# Patient Record
Sex: Female | Born: 1955 | Race: White | Hispanic: No | Marital: Married | Smoking: Former smoker
Health system: Southern US, Community
[De-identification: ages and names within clinical notes are randomized; demographics above are authoritative.]

## PROBLEM LIST (undated history)

## (undated) DIAGNOSIS — C50919 Malignant neoplasm of unspecified site of unspecified female breast: Principal | ICD-10-CM

## (undated) DIAGNOSIS — Q8789 Other specified congenital malformation syndromes, not elsewhere classified: Secondary | ICD-10-CM

## (undated) DIAGNOSIS — L813 Cafe au lait spots: Secondary | ICD-10-CM

## (undated) HISTORY — PX: KNEE ARTHROSCOPY WITH EXCISION BAKER'S CYST: SHX5646

## (undated) HISTORY — DX: Other specified congenital malformation syndromes, not elsewhere classified: Q87.89

## (undated) HISTORY — DX: Malignant neoplasm of unspecified site of unspecified female breast: C50.919

## (undated) HISTORY — DX: Other specified congenital malformation syndromes, not elsewhere classified: L81.3

## (undated) HISTORY — PX: CHOLECYSTECTOMY: SHX55

## (undated) HISTORY — PX: COLONOSCOPY: SHX174

---

## 1992-11-05 HISTORY — PX: TUBAL LIGATION: SHX77

## 1999-01-02 ENCOUNTER — Emergency Department (HOSPITAL_COMMUNITY): Admission: EM | Admit: 1999-01-02 | Discharge: 1999-01-02 | Payer: Self-pay | Admitting: Emergency Medicine

## 1999-10-18 ENCOUNTER — Other Ambulatory Visit: Admission: RE | Admit: 1999-10-18 | Discharge: 1999-10-18 | Payer: Self-pay | Admitting: Obstetrics and Gynecology

## 2000-11-11 ENCOUNTER — Other Ambulatory Visit: Admission: RE | Admit: 2000-11-11 | Discharge: 2000-11-11 | Payer: Self-pay | Admitting: Obstetrics and Gynecology

## 2002-02-18 ENCOUNTER — Other Ambulatory Visit: Admission: RE | Admit: 2002-02-18 | Discharge: 2002-02-18 | Payer: Self-pay | Admitting: Obstetrics and Gynecology

## 2002-07-29 ENCOUNTER — Encounter: Admission: RE | Admit: 2002-07-29 | Discharge: 2002-07-29 | Payer: Self-pay | Admitting: Obstetrics and Gynecology

## 2002-07-29 ENCOUNTER — Encounter: Payer: Self-pay | Admitting: Obstetrics and Gynecology

## 2002-08-11 ENCOUNTER — Encounter: Admission: RE | Admit: 2002-08-11 | Discharge: 2002-08-11 | Payer: Self-pay | Admitting: Obstetrics and Gynecology

## 2002-08-11 ENCOUNTER — Encounter: Payer: Self-pay | Admitting: Obstetrics and Gynecology

## 2002-08-20 ENCOUNTER — Ambulatory Visit (HOSPITAL_BASED_OUTPATIENT_CLINIC_OR_DEPARTMENT_OTHER): Admission: RE | Admit: 2002-08-20 | Discharge: 2002-08-20 | Payer: Self-pay | Admitting: General Surgery

## 2002-08-20 ENCOUNTER — Encounter (INDEPENDENT_AMBULATORY_CARE_PROVIDER_SITE_OTHER): Payer: Self-pay | Admitting: *Deleted

## 2003-04-05 ENCOUNTER — Encounter: Payer: Self-pay | Admitting: Obstetrics and Gynecology

## 2003-04-05 ENCOUNTER — Encounter: Admission: RE | Admit: 2003-04-05 | Discharge: 2003-04-05 | Payer: Self-pay | Admitting: Obstetrics and Gynecology

## 2004-03-27 ENCOUNTER — Other Ambulatory Visit: Admission: RE | Admit: 2004-03-27 | Discharge: 2004-03-27 | Payer: Self-pay | Admitting: Obstetrics and Gynecology

## 2004-05-11 ENCOUNTER — Encounter: Admission: RE | Admit: 2004-05-11 | Discharge: 2004-05-11 | Payer: Self-pay | Admitting: Obstetrics and Gynecology

## 2004-11-13 ENCOUNTER — Emergency Department (HOSPITAL_COMMUNITY): Admission: EM | Admit: 2004-11-13 | Discharge: 2004-11-13 | Payer: Self-pay | Admitting: Emergency Medicine

## 2005-02-07 ENCOUNTER — Ambulatory Visit: Payer: Self-pay | Admitting: Internal Medicine

## 2005-04-18 ENCOUNTER — Other Ambulatory Visit: Admission: RE | Admit: 2005-04-18 | Discharge: 2005-04-18 | Payer: Self-pay | Admitting: Obstetrics and Gynecology

## 2005-05-18 ENCOUNTER — Encounter: Admission: RE | Admit: 2005-05-18 | Discharge: 2005-05-18 | Payer: Self-pay | Admitting: Obstetrics and Gynecology

## 2006-04-25 ENCOUNTER — Other Ambulatory Visit: Admission: RE | Admit: 2006-04-25 | Discharge: 2006-04-25 | Payer: Self-pay | Admitting: Obstetrics and Gynecology

## 2006-05-20 ENCOUNTER — Encounter: Admission: RE | Admit: 2006-05-20 | Discharge: 2006-05-20 | Payer: Self-pay | Admitting: Obstetrics and Gynecology

## 2007-04-30 ENCOUNTER — Other Ambulatory Visit: Admission: RE | Admit: 2007-04-30 | Discharge: 2007-04-30 | Payer: Self-pay | Admitting: Obstetrics and Gynecology

## 2007-07-25 ENCOUNTER — Encounter: Admission: RE | Admit: 2007-07-25 | Discharge: 2007-07-25 | Payer: Self-pay | Admitting: Obstetrics and Gynecology

## 2007-11-06 DIAGNOSIS — C50919 Malignant neoplasm of unspecified site of unspecified female breast: Secondary | ICD-10-CM

## 2007-11-06 HISTORY — DX: Malignant neoplasm of unspecified site of unspecified female breast: C50.919

## 2007-12-15 ENCOUNTER — Ambulatory Visit: Payer: Self-pay | Admitting: Internal Medicine

## 2008-01-01 ENCOUNTER — Ambulatory Visit: Payer: Self-pay | Admitting: Internal Medicine

## 2008-01-01 ENCOUNTER — Encounter: Payer: Self-pay | Admitting: Internal Medicine

## 2008-05-03 ENCOUNTER — Other Ambulatory Visit: Admission: RE | Admit: 2008-05-03 | Discharge: 2008-05-03 | Payer: Self-pay | Admitting: Obstetrics and Gynecology

## 2008-05-12 ENCOUNTER — Encounter: Admission: RE | Admit: 2008-05-12 | Discharge: 2008-05-12 | Payer: Self-pay | Admitting: Obstetrics and Gynecology

## 2008-05-12 ENCOUNTER — Encounter (INDEPENDENT_AMBULATORY_CARE_PROVIDER_SITE_OTHER): Payer: Self-pay | Admitting: Diagnostic Radiology

## 2008-05-20 ENCOUNTER — Encounter: Admission: RE | Admit: 2008-05-20 | Discharge: 2008-05-20 | Payer: Self-pay | Admitting: Obstetrics and Gynecology

## 2008-06-05 HISTORY — PX: MASTECTOMY: SHX3

## 2008-06-07 ENCOUNTER — Encounter: Admission: RE | Admit: 2008-06-07 | Discharge: 2008-06-07 | Payer: Self-pay | Admitting: Surgery

## 2008-06-08 ENCOUNTER — Encounter (INDEPENDENT_AMBULATORY_CARE_PROVIDER_SITE_OTHER): Payer: Self-pay | Admitting: Surgery

## 2008-06-08 ENCOUNTER — Ambulatory Visit (HOSPITAL_BASED_OUTPATIENT_CLINIC_OR_DEPARTMENT_OTHER): Admission: RE | Admit: 2008-06-08 | Discharge: 2008-06-09 | Payer: Self-pay | Admitting: Surgery

## 2008-06-14 ENCOUNTER — Ambulatory Visit: Payer: Self-pay | Admitting: Oncology

## 2008-06-22 ENCOUNTER — Ambulatory Visit: Payer: Self-pay | Admitting: Hematology & Oncology

## 2008-07-17 ENCOUNTER — Emergency Department (HOSPITAL_BASED_OUTPATIENT_CLINIC_OR_DEPARTMENT_OTHER): Admission: EM | Admit: 2008-07-17 | Discharge: 2008-07-18 | Payer: Self-pay | Admitting: Emergency Medicine

## 2008-08-02 LAB — COMPREHENSIVE METABOLIC PANEL
ALT: 14 U/L (ref 0–35)
AST: 11 U/L (ref 0–37)
Albumin: 4.5 g/dL (ref 3.5–5.2)
Alkaline Phosphatase: 70 U/L (ref 39–117)
Glucose, Bld: 117 mg/dL — ABNORMAL HIGH (ref 70–99)
Potassium: 4.2 mEq/L (ref 3.5–5.3)
Sodium: 137 mEq/L (ref 135–145)
Total Protein: 7.4 g/dL (ref 6.0–8.3)

## 2008-08-02 LAB — CBC WITH DIFFERENTIAL (CANCER CENTER ONLY)
BASO%: 0.7 % (ref 0.0–2.0)
HCT: 39.7 % (ref 34.8–46.6)
LYMPH%: 6.3 % — ABNORMAL LOW (ref 14.0–48.0)
MCH: 29 pg (ref 26.0–34.0)
MCV: 84 fL (ref 81–101)
MONO%: 6 % (ref 0.0–13.0)
NEUT%: 86 % — ABNORMAL HIGH (ref 39.6–80.0)
Platelets: 412 10*3/uL — ABNORMAL HIGH (ref 145–400)
RDW: 13.1 % (ref 10.5–14.6)

## 2008-08-20 ENCOUNTER — Ambulatory Visit: Payer: Self-pay | Admitting: Hematology & Oncology

## 2008-08-23 LAB — COMPREHENSIVE METABOLIC PANEL
Albumin: 4.5 g/dL (ref 3.5–5.2)
CO2: 21 mEq/L (ref 19–32)
Calcium: 10.2 mg/dL (ref 8.4–10.5)
Glucose, Bld: 106 mg/dL — ABNORMAL HIGH (ref 70–99)
Sodium: 137 mEq/L (ref 135–145)
Total Bilirubin: 0.7 mg/dL (ref 0.3–1.2)
Total Protein: 7.1 g/dL (ref 6.0–8.3)

## 2008-08-23 LAB — CBC WITH DIFFERENTIAL (CANCER CENTER ONLY)
BASO#: 0.1 10*3/uL (ref 0.0–0.2)
Eosinophils Absolute: 0.2 10*3/uL (ref 0.0–0.5)
HCT: 35.8 % (ref 34.8–46.6)
HGB: 12.3 g/dL (ref 11.6–15.9)
LYMPH%: 4.5 % — ABNORMAL LOW (ref 14.0–48.0)
MCH: 29.2 pg (ref 26.0–34.0)
MCV: 85 fL (ref 81–101)
MONO#: 1.2 10*3/uL — ABNORMAL HIGH (ref 0.1–0.9)
MONO%: 6.4 % (ref 0.0–13.0)
NEUT%: 87.4 % — ABNORMAL HIGH (ref 39.6–80.0)
RBC: 4.21 10*6/uL (ref 3.70–5.32)

## 2008-09-13 LAB — CBC WITH DIFFERENTIAL (CANCER CENTER ONLY)
BASO%: 0.7 % (ref 0.0–2.0)
EOS%: 1.4 % (ref 0.0–7.0)
LYMPH%: 4.3 % — ABNORMAL LOW (ref 14.0–48.0)
MCH: 29.9 pg (ref 26.0–34.0)
MCHC: 34.2 g/dL (ref 32.0–36.0)
MCV: 87 fL (ref 81–101)
MONO%: 3.7 % (ref 0.0–13.0)
Platelets: 287 10*3/uL (ref 145–400)
RDW: 13.6 % (ref 10.5–14.6)

## 2008-10-04 LAB — CBC WITH DIFFERENTIAL (CANCER CENTER ONLY)
BASO#: 0.1 10*3/uL (ref 0.0–0.2)
Eosinophils Absolute: 0.2 10*3/uL (ref 0.0–0.5)
HGB: 12.4 g/dL (ref 11.6–15.9)
LYMPH#: 1 10*3/uL (ref 0.9–3.3)
MCH: 29.5 pg (ref 26.0–34.0)
MONO#: 1 10*3/uL — ABNORMAL HIGH (ref 0.1–0.9)
MONO%: 5.4 % (ref 0.0–13.0)
NEUT#: 16.2 10*3/uL — ABNORMAL HIGH (ref 1.5–6.5)
RBC: 4.2 10*6/uL (ref 3.70–5.32)
WBC: 18.6 10*3/uL — ABNORMAL HIGH (ref 3.9–10.0)

## 2008-10-04 LAB — COMPREHENSIVE METABOLIC PANEL
ALT: 14 U/L (ref 0–35)
Alkaline Phosphatase: 88 U/L (ref 39–117)
Glucose, Bld: 123 mg/dL — ABNORMAL HIGH (ref 70–99)
Sodium: 139 mEq/L (ref 135–145)
Total Bilirubin: 0.5 mg/dL (ref 0.3–1.2)
Total Protein: 7 g/dL (ref 6.0–8.3)

## 2008-10-28 ENCOUNTER — Ambulatory Visit: Payer: Self-pay | Admitting: Hematology & Oncology

## 2008-11-01 LAB — CBC WITH DIFFERENTIAL (CANCER CENTER ONLY)
BASO#: 0.1 10*3/uL (ref 0.0–0.2)
Eosinophils Absolute: 0.1 10*3/uL (ref 0.0–0.5)
HGB: 13.8 g/dL (ref 11.6–15.9)
LYMPH%: 7.6 % — ABNORMAL LOW (ref 14.0–48.0)
MCH: 29.1 pg (ref 26.0–34.0)
MCHC: 33.9 g/dL (ref 32.0–36.0)
MCV: 86 fL (ref 81–101)
MONO%: 7.2 % (ref 0.0–13.0)
RBC: 4.74 10*6/uL (ref 3.70–5.32)

## 2008-11-01 LAB — COMPREHENSIVE METABOLIC PANEL
Alkaline Phosphatase: 73 U/L (ref 39–117)
Creatinine, Ser: 0.59 mg/dL (ref 0.40–1.20)
Glucose, Bld: 100 mg/dL — ABNORMAL HIGH (ref 70–99)
Sodium: 139 mEq/L (ref 135–145)
Total Bilirubin: 0.8 mg/dL (ref 0.3–1.2)
Total Protein: 7.1 g/dL (ref 6.0–8.3)

## 2008-11-29 LAB — CBC WITH DIFFERENTIAL (CANCER CENTER ONLY)
BASO#: 0 10*3/uL (ref 0.0–0.2)
Eosinophils Absolute: 0.3 10*3/uL (ref 0.0–0.5)
HCT: 35.8 % (ref 34.8–46.6)
HGB: 12.4 g/dL (ref 11.6–15.9)
LYMPH#: 1.1 10*3/uL (ref 0.9–3.3)
MCH: 28.8 pg (ref 26.0–34.0)
MONO%: 6.8 % (ref 0.0–13.0)
NEUT#: 3.6 10*3/uL (ref 1.5–6.5)
NEUT%: 67.2 % (ref 39.6–80.0)
RBC: 4.29 10*6/uL (ref 3.70–5.32)

## 2008-11-29 LAB — COMPREHENSIVE METABOLIC PANEL
CO2: 25 mEq/L (ref 19–32)
Calcium: 9.2 mg/dL (ref 8.4–10.5)
Chloride: 104 mEq/L (ref 96–112)
Creatinine, Ser: 0.63 mg/dL (ref 0.40–1.20)
Glucose, Bld: 97 mg/dL (ref 70–99)
Total Bilirubin: 0.8 mg/dL (ref 0.3–1.2)
Total Protein: 6.2 g/dL (ref 6.0–8.3)

## 2008-11-29 LAB — FOLLICLE STIMULATING HORMONE: FSH: 91 m[IU]/mL

## 2008-11-29 LAB — LUTEINIZING HORMONE: LH: 53.7 m[IU]/mL

## 2008-12-06 HISTORY — PX: BREAST RECONSTRUCTION: SHX9

## 2008-12-08 LAB — ESTRADIOL, ULTRA SENS

## 2008-12-31 ENCOUNTER — Ambulatory Visit: Payer: Self-pay | Admitting: Hematology & Oncology

## 2009-01-03 LAB — COMPREHENSIVE METABOLIC PANEL
ALT: 10 U/L (ref 0–35)
Albumin: 4.1 g/dL (ref 3.5–5.2)
CO2: 30 mEq/L (ref 19–32)
Glucose, Bld: 86 mg/dL (ref 70–99)
Potassium: 4.3 mEq/L (ref 3.5–5.3)
Sodium: 140 mEq/L (ref 135–145)
Total Protein: 6.4 g/dL (ref 6.0–8.3)

## 2009-01-03 LAB — CBC WITH DIFFERENTIAL (CANCER CENTER ONLY)
BASO#: 0 10*3/uL (ref 0.0–0.2)
EOS%: 4.3 % (ref 0.0–7.0)
Eosinophils Absolute: 0.3 10*3/uL (ref 0.0–0.5)
HCT: 39.1 % (ref 34.8–46.6)
HGB: 12.8 g/dL (ref 11.6–15.9)
LYMPH#: 1 10*3/uL (ref 0.9–3.3)
MCHC: 32.7 g/dL (ref 32.0–36.0)
MONO#: 0.4 10*3/uL (ref 0.1–0.9)
NEUT#: 5.6 10*3/uL (ref 1.5–6.5)
NEUT%: 75.9 % (ref 39.6–80.0)
RBC: 4.65 10*6/uL (ref 3.70–5.32)
WBC: 7.4 10*3/uL (ref 3.9–10.0)

## 2009-03-11 ENCOUNTER — Ambulatory Visit: Payer: Self-pay | Admitting: Hematology & Oncology

## 2009-03-14 LAB — CBC WITH DIFFERENTIAL (CANCER CENTER ONLY)
BASO%: 0.4 % (ref 0.0–2.0)
EOS%: 4.1 % (ref 0.0–7.0)
LYMPH#: 1.1 10*3/uL (ref 0.9–3.3)
MCHC: 33.8 g/dL (ref 32.0–36.0)
MONO#: 0.3 10*3/uL (ref 0.1–0.9)
NEUT#: 2.4 10*3/uL (ref 1.5–6.5)
Platelets: 249 10*3/uL (ref 145–400)
RDW: 13.2 % (ref 10.5–14.6)
WBC: 4 10*3/uL (ref 3.9–10.0)

## 2009-03-14 LAB — COMPREHENSIVE METABOLIC PANEL
BUN: 11 mg/dL (ref 6–23)
CO2: 28 mEq/L (ref 19–32)
Calcium: 9.2 mg/dL (ref 8.4–10.5)
Creatinine, Ser: 0.7 mg/dL (ref 0.40–1.20)
Glucose, Bld: 77 mg/dL (ref 70–99)
Total Bilirubin: 0.8 mg/dL (ref 0.3–1.2)

## 2009-04-16 IMAGING — CR DG CHEST 2V
2 series · 2 of 2 positions shown · non-contrast
Comparison: None available

CLINICAL DATA: Breast carcinoma

CHEST - 2 VIEW

[w chest pa]
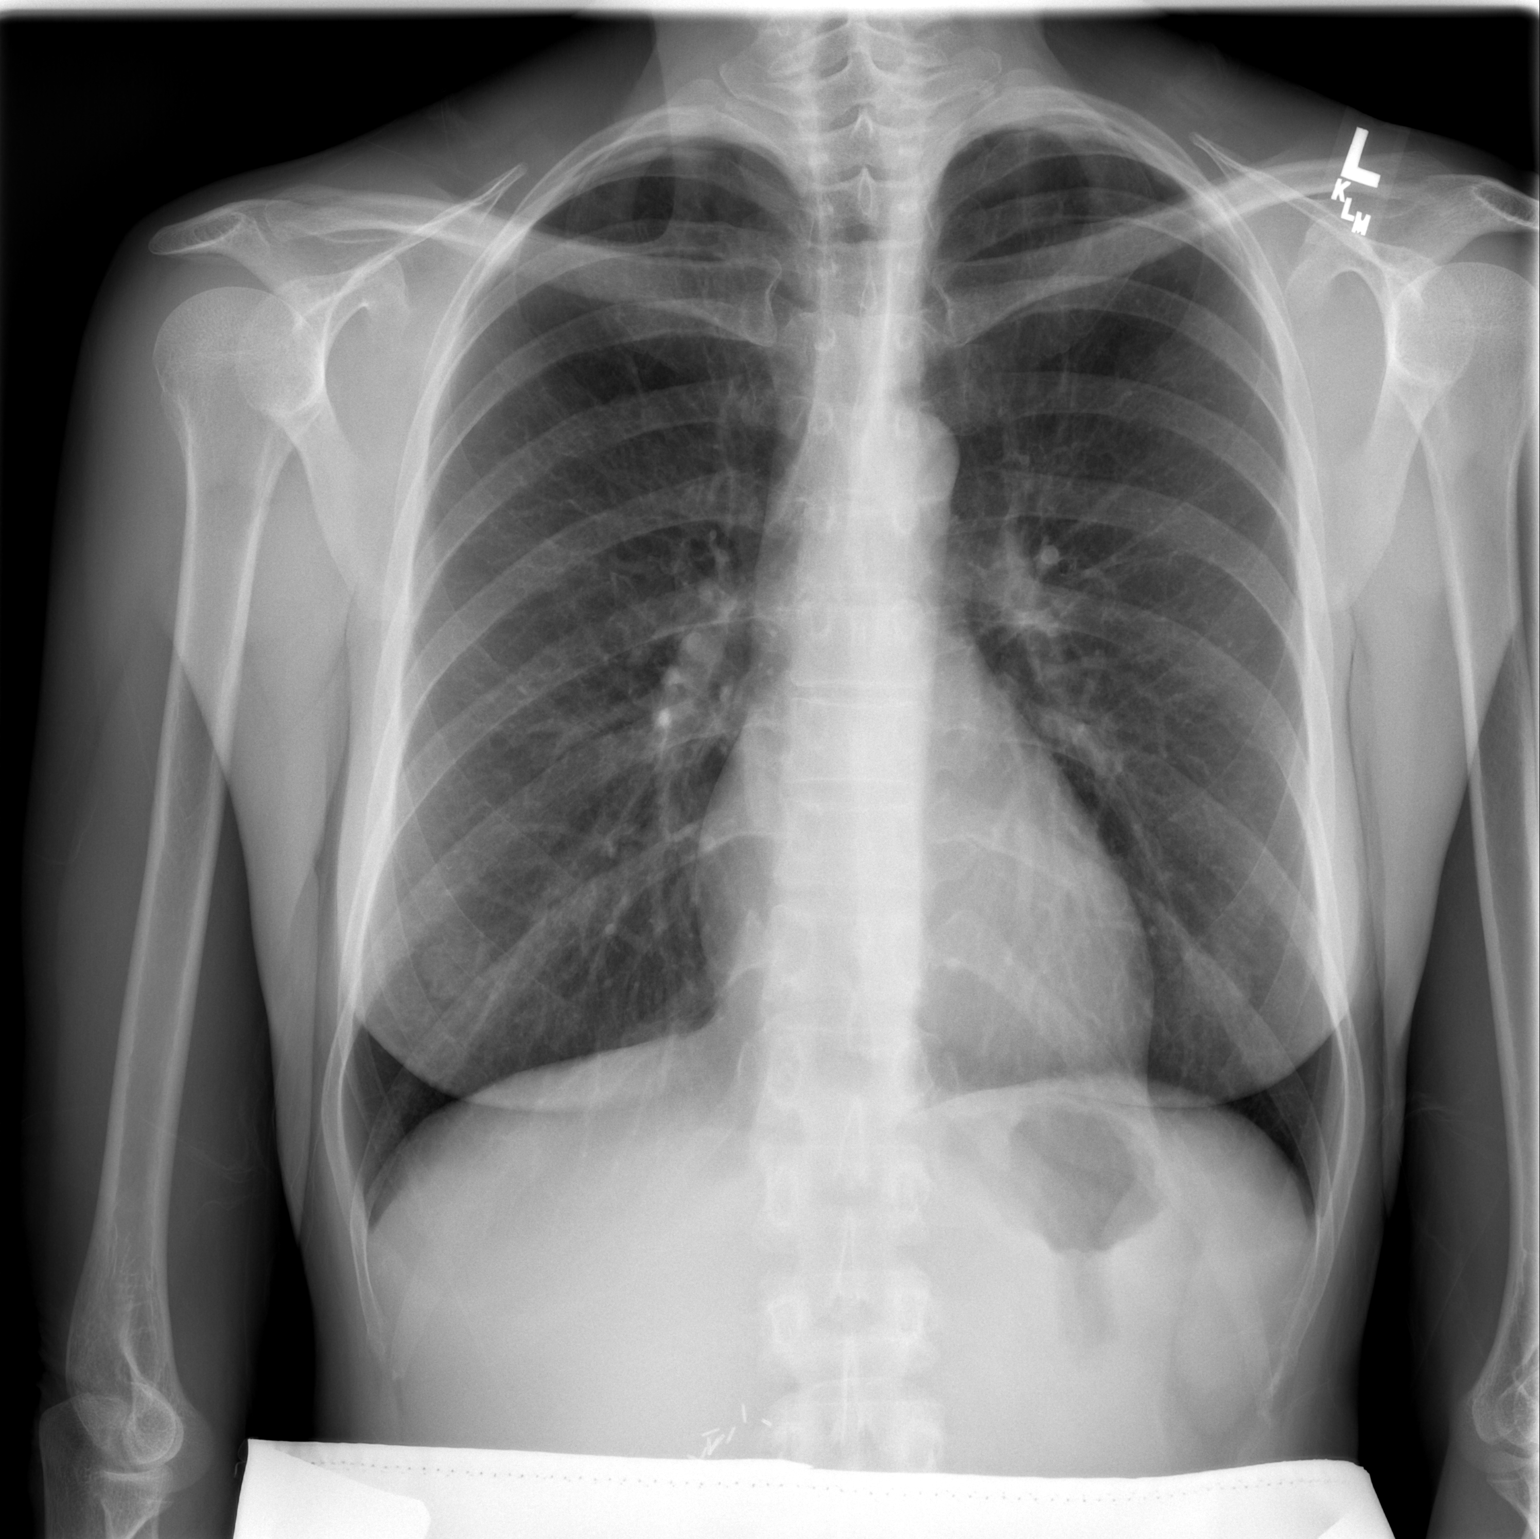

[w chest lat]
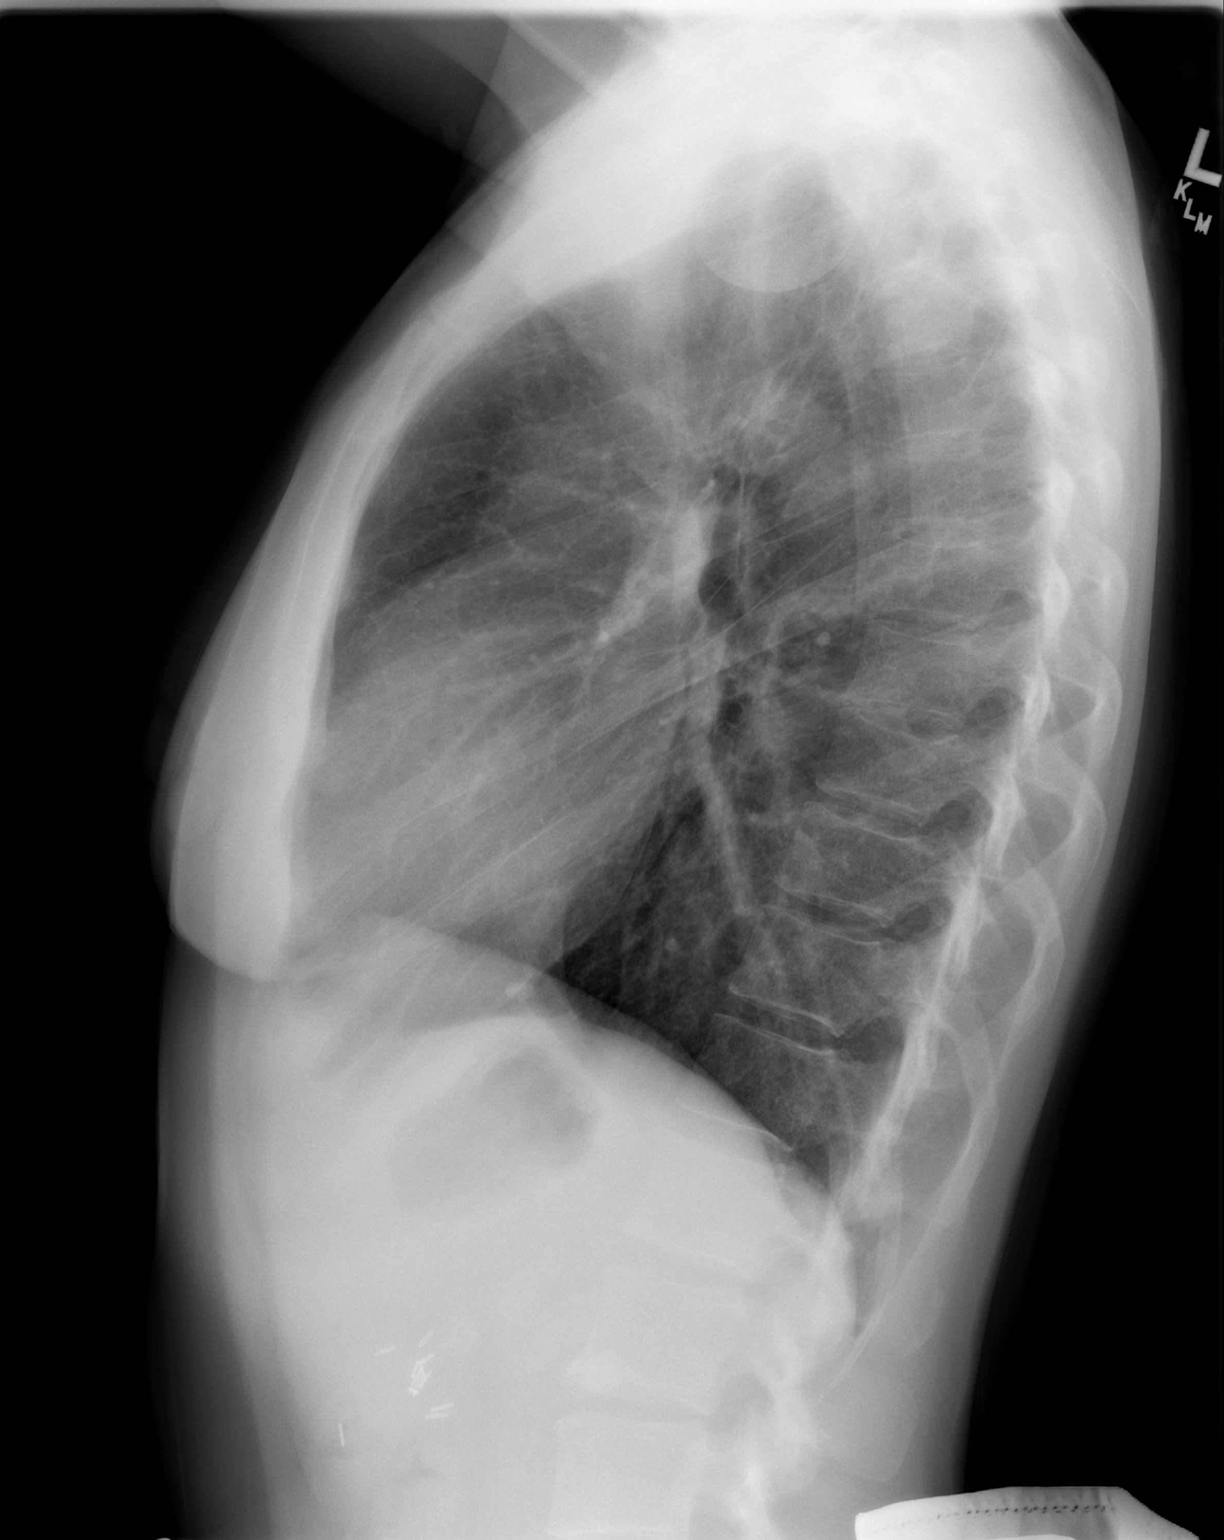

[2 of 2 positions shown; findings below may reference images not displayed]

FINDINGS: Vascular clips in the right upper abdomen.  The lower
abdomen was shielded. Lungs clear.  Heart size and pulmonary
vascularity normal.  No effusion.  Visualized bones unremarkable.]
IMPRESSION: [1.  No acute disease

## 2009-05-10 ENCOUNTER — Emergency Department (HOSPITAL_BASED_OUTPATIENT_CLINIC_OR_DEPARTMENT_OTHER): Admission: EM | Admit: 2009-05-10 | Discharge: 2009-05-10 | Payer: Self-pay | Admitting: Emergency Medicine

## 2009-06-01 ENCOUNTER — Other Ambulatory Visit: Admission: RE | Admit: 2009-06-01 | Discharge: 2009-06-01 | Payer: Self-pay | Admitting: Obstetrics and Gynecology

## 2009-06-14 ENCOUNTER — Ambulatory Visit: Payer: Self-pay | Admitting: Hematology & Oncology

## 2009-06-15 LAB — CBC WITH DIFFERENTIAL (CANCER CENTER ONLY)
BASO%: 1.2 % (ref 0.0–2.0)
Eosinophils Absolute: 0.3 10*3/uL (ref 0.0–0.5)
MONO#: 0.4 10*3/uL (ref 0.1–0.9)
MONO%: 7.3 % (ref 0.0–13.0)
NEUT#: 3.6 10*3/uL (ref 1.5–6.5)
Platelets: 254 10*3/uL (ref 145–400)
RBC: 4.83 10*6/uL (ref 3.70–5.32)
RDW: 12.4 % (ref 10.5–14.6)
WBC: 5.9 10*3/uL (ref 3.9–10.0)

## 2009-06-16 LAB — COMPREHENSIVE METABOLIC PANEL
ALT: 10 U/L (ref 0–35)
AST: 11 U/L (ref 0–37)
Alkaline Phosphatase: 70 U/L (ref 39–117)
CO2: 27 mEq/L (ref 19–32)
Sodium: 137 mEq/L (ref 135–145)
Total Bilirubin: 0.8 mg/dL (ref 0.3–1.2)
Total Protein: 6.6 g/dL (ref 6.0–8.3)

## 2009-06-17 ENCOUNTER — Encounter: Admission: RE | Admit: 2009-06-17 | Discharge: 2009-06-17 | Payer: Self-pay | Admitting: Hematology & Oncology

## 2009-09-13 ENCOUNTER — Ambulatory Visit: Payer: Self-pay | Admitting: Hematology & Oncology

## 2009-09-14 LAB — CBC WITH DIFFERENTIAL (CANCER CENTER ONLY)
EOS%: 4.3 % (ref 0.0–7.0)
MCH: 28.8 pg (ref 26.0–34.0)
MCHC: 35 g/dL (ref 32.0–36.0)
MONO%: 6.5 % (ref 0.0–13.0)
NEUT#: 3.8 10*3/uL (ref 1.5–6.5)
Platelets: 242 10*3/uL (ref 145–400)
RDW: 12 % (ref 10.5–14.6)

## 2009-09-15 LAB — COMPREHENSIVE METABOLIC PANEL
Albumin: 4.4 g/dL (ref 3.5–5.2)
CO2: 30 mEq/L (ref 19–32)
Calcium: 9.9 mg/dL (ref 8.4–10.5)
Glucose, Bld: 89 mg/dL (ref 70–99)
Sodium: 141 mEq/L (ref 135–145)
Total Bilirubin: 0.6 mg/dL (ref 0.3–1.2)
Total Protein: 6.9 g/dL (ref 6.0–8.3)

## 2009-09-15 LAB — VITAMIN D 25 HYDROXY (VIT D DEFICIENCY, FRACTURES): Vit D, 25-Hydroxy: 33 ng/mL (ref 30–89)

## 2009-12-14 ENCOUNTER — Ambulatory Visit: Payer: Self-pay | Admitting: Hematology & Oncology

## 2009-12-15 LAB — CBC WITH DIFFERENTIAL (CANCER CENTER ONLY)
Eosinophils Absolute: 0.2 10*3/uL (ref 0.0–0.5)
HCT: 42.5 % (ref 34.8–46.6)
LYMPH%: 22.9 % (ref 14.0–48.0)
MCV: 86 fL (ref 81–101)
MONO#: 0.4 10*3/uL (ref 0.1–0.9)
NEUT%: 67.4 % (ref 39.6–80.0)
RBC: 4.97 10*6/uL (ref 3.70–5.32)
WBC: 5.9 10*3/uL (ref 3.9–10.0)

## 2009-12-15 LAB — COMPREHENSIVE METABOLIC PANEL
BUN: 13 mg/dL (ref 6–23)
CO2: 26 mEq/L (ref 19–32)
Creatinine, Ser: 0.73 mg/dL (ref 0.40–1.20)
Glucose, Bld: 89 mg/dL (ref 70–99)
Total Bilirubin: 1.1 mg/dL (ref 0.3–1.2)

## 2010-04-06 ENCOUNTER — Ambulatory Visit: Payer: Self-pay | Admitting: Hematology & Oncology

## 2010-04-06 LAB — CBC WITH DIFFERENTIAL (CANCER CENTER ONLY)
EOS%: 3.4 % (ref 0.0–7.0)
Eosinophils Absolute: 0.2 10*3/uL (ref 0.0–0.5)
LYMPH%: 25.5 % (ref 14.0–48.0)
MCH: 28.8 pg (ref 26.0–34.0)
MCHC: 33.8 g/dL (ref 32.0–36.0)
MCV: 85 fL (ref 81–101)
MONO%: 5.9 % (ref 0.0–13.0)
Platelets: 244 10*3/uL (ref 145–400)
RBC: 4.88 10*6/uL (ref 3.70–5.32)

## 2010-04-06 LAB — COMPREHENSIVE METABOLIC PANEL
AST: 10 U/L (ref 0–37)
Alkaline Phosphatase: 82 U/L (ref 39–117)
BUN: 10 mg/dL (ref 6–23)
Glucose, Bld: 107 mg/dL — ABNORMAL HIGH (ref 70–99)
Sodium: 139 mEq/L (ref 135–145)
Total Bilirubin: 1.2 mg/dL (ref 0.3–1.2)
Total Protein: 6.6 g/dL (ref 6.0–8.3)

## 2010-08-08 ENCOUNTER — Ambulatory Visit (HOSPITAL_BASED_OUTPATIENT_CLINIC_OR_DEPARTMENT_OTHER): Payer: BC Managed Care – PPO | Admitting: Hematology & Oncology

## 2010-08-10 LAB — CBC WITH DIFFERENTIAL (CANCER CENTER ONLY)
BASO#: 0 10*3/uL (ref 0.0–0.2)
BASO%: 0.5 % (ref 0.0–2.0)
EOS%: 3.8 % (ref 0.0–7.0)
HCT: 42.8 % (ref 34.8–46.6)
HGB: 14.2 g/dL (ref 11.6–15.9)
LYMPH#: 1.5 10*3/uL (ref 0.9–3.3)
MCHC: 33.2 g/dL (ref 32.0–36.0)
MONO#: 0.5 10*3/uL (ref 0.1–0.9)
NEUT#: 3.6 10*3/uL (ref 1.5–6.5)
NEUT%: 61.1 % (ref 39.6–80.0)
WBC: 5.8 10*3/uL (ref 3.9–10.0)

## 2010-08-11 LAB — COMPREHENSIVE METABOLIC PANEL
ALT: 14 U/L (ref 0–35)
AST: 15 U/L (ref 0–37)
Albumin: 4.8 g/dL (ref 3.5–5.2)
BUN: 12 mg/dL (ref 6–23)
CO2: 26 mEq/L (ref 19–32)
Calcium: 9.8 mg/dL (ref 8.4–10.5)
Chloride: 101 mEq/L (ref 96–112)
Creatinine, Ser: 0.73 mg/dL (ref 0.40–1.20)
Potassium: 4.2 mEq/L (ref 3.5–5.3)

## 2010-08-11 LAB — VITAMIN D 25 HYDROXY (VIT D DEFICIENCY, FRACTURES): Vit D, 25-Hydroxy: 60 ng/mL (ref 30–89)

## 2010-11-27 ENCOUNTER — Encounter: Payer: Self-pay | Admitting: Obstetrics and Gynecology

## 2010-12-14 ENCOUNTER — Encounter (HOSPITAL_BASED_OUTPATIENT_CLINIC_OR_DEPARTMENT_OTHER): Payer: BC Managed Care – PPO | Admitting: Hematology & Oncology

## 2010-12-14 DIAGNOSIS — C50419 Malignant neoplasm of upper-outer quadrant of unspecified female breast: Secondary | ICD-10-CM

## 2010-12-14 LAB — CBC WITH DIFFERENTIAL (CANCER CENTER ONLY)
BASO%: 0.6 % (ref 0.0–2.0)
EOS%: 3.5 % (ref 0.0–7.0)
HGB: 14.4 g/dL (ref 11.6–15.9)
LYMPH#: 1.4 10*3/uL (ref 0.9–3.3)
MCH: 28.7 pg (ref 26.0–34.0)
MCHC: 33.7 g/dL (ref 32.0–36.0)
MONO%: 6.4 % (ref 0.0–13.0)
NEUT#: 3.6 10*3/uL (ref 1.5–6.5)
Platelets: 244 10*3/uL (ref 145–400)

## 2010-12-15 LAB — COMPREHENSIVE METABOLIC PANEL
ALT: 12 U/L (ref 0–35)
AST: 11 U/L (ref 0–37)
Albumin: 4.3 g/dL (ref 3.5–5.2)
Alkaline Phosphatase: 89 U/L (ref 39–117)
BUN: 13 mg/dL (ref 6–23)
Calcium: 9.7 mg/dL (ref 8.4–10.5)
Chloride: 102 mEq/L (ref 96–112)
Potassium: 4.1 mEq/L (ref 3.5–5.3)
Sodium: 138 mEq/L (ref 135–145)
Total Protein: 6.8 g/dL (ref 6.0–8.3)

## 2011-03-20 NOTE — Op Note (Signed)
Anna Barton, Anna Barton               ACCOUNT NO.:  [id number]   MEDICAL RECORD NO.:  [id number]          PATIENT TYPE:  AMB   LOCATION:  DSC                          FACILITY:  [facility]   PHYSICIAN:  Thomas A. Cornett, M.D.DATE OF BIRTH:  [birth year]   DATE OF PROCEDURE:  06/08/2008  DATE OF DISCHARGE:                               OPERATIVE REPORT   PREOPERATIVE DIAGNOSIS:  Right breast cancer.   POSTOPERATIVE DIAGNOSIS:  Right breast cancer.   PROCEDURES:  1. Bilateral simple mastectomies.  2. Right axillary sentinel lymph node mapping with injection of      methylene blue dye.   SURGEON:  Maisie Fus A. Cornett, MD   ANESTHESIA:  LMA.   ESTIMATED BLOOD LOSS:  30 mL.   SPECIMEN:  1. Both breasts to Pathology.  2. Three right axillary sentinel lymph nodes negative by touch prep.   DRAINS:  Two 19 Blake drains in the right side and one 19 Blake drain in  the left side.   INDICATIONS FOR PROCEDURE:  The patient is a 55 year old female with a 3-  cm right breast cancer.  She had talked about options initially and  wanted to undergo a simple mastectomy since she had no interest in  preoperative chemotherapy.  She had no interest in breast conserving  surgery.  She then decided she wished to have both breasts removed at  the same time given her young age and concern for developing a second  breast cancer on her left side.  She was counseled and agreed to proceed  after thinking about this and expressing that was her desire.   DESCRIPTION OF PROCEDURE:  The patient was brought to the operating  room.  She was then placed supine and LMA anesthesia was initiated.  She  was injected in the right breast by the radiology technician with  technetium sulfur colloid.  I then injected 4 mL of methylene blue dye  in a subareolar position on the right breast.  We then prepped and  draped in a sterile fashion both breasts.  The left breast was addressed  first.  A fishmouth incision above and  below the nipple-areolar complex  was created.  Superior and inferior skin flaps were created using the  cautery up towards the clavicle and down to the inferior mammary fold.  The breast was then dissected off the chest wall in a medial to lateral  fashion and amputated prior to entering the axilla on the left.  Hemostasis was achieved with cautery.  Through a separate stab incision,  a 19 Blake drain was placed.  This was secured to the skin with 2-0  nylon.  I then closed the wound after irrigating the wound and assuring  hemostasis with 3-0 Vicryl pop-offs and 4-0 Monocryl in a subcuticular  stitch.   We then switched to the right side.  The left side was covered.  In a  similar fashion, a fishmouth incision was used.  The superior and  inferior skin flaps were then created up to the clavicle and down to the  inferior mammary fold.  Once we got  up in the left axillary region, we  used the NeoProbe and identified 3 sentinel lymph nodes, 2 of these were  blue and hot, and the third was just hot.  All 3 sentinel nodes were  negative by touch prep.  We irrigated out the axilla; when we were done,  this was dry.  I then completed the superior flap.  The inferior flap  was then completed as well at this point in time down to the  inframammary fold.  The breast was then dissected away in a medial to  lateral fashion to include the tail of Spence as well, but leaving the  axillary contents behind.  Of note, the tail of Mliss Sax was also removed  on the left side.  After removal, we inspected for hemostasis and  irrigated and found to be excellent.  We then closed the wound in layers  with a 3-0 Vicryl deep layer and a subsequent 4-0 Monocryl layer.  Of  note, 2 drains were used in this side and these were placed through  separate stab incisions and secured to the skin with 2-0 nylon.  Once  the incisions were closed, Dermabond was applied as a dressing.  Drain  sponges were placed around the  drains.  All final counts of sponge,  needle, and instruments were found to be correct at this portion of the  case.  The drains were placed to suction with good seal.  She was then  awoke and taken to recovery in satisfactory condition.      Thomas A. Cornett, M.D.  Electronically Signed     TAC/MEDQ  D:  06/08/2008  T:  06/09/2008  Job:  [id number]   cc:   Holley Bouche, M.D.

## 2011-03-30 ENCOUNTER — Encounter (INDEPENDENT_AMBULATORY_CARE_PROVIDER_SITE_OTHER): Payer: Self-pay | Admitting: Surgery

## 2011-06-14 ENCOUNTER — Other Ambulatory Visit: Payer: Self-pay | Admitting: Hematology & Oncology

## 2011-06-14 ENCOUNTER — Encounter (HOSPITAL_BASED_OUTPATIENT_CLINIC_OR_DEPARTMENT_OTHER): Payer: BC Managed Care – PPO | Admitting: Hematology & Oncology

## 2011-06-14 DIAGNOSIS — C50419 Malignant neoplasm of upper-outer quadrant of unspecified female breast: Secondary | ICD-10-CM

## 2011-06-14 LAB — CBC WITH DIFFERENTIAL (CANCER CENTER ONLY)
BASO#: 0.1 10e3/uL (ref 0.0–0.2)
BASO%: 0.9 % (ref 0.0–2.0)
EOS%: 5.7 % (ref 0.0–7.0)
Eosinophils Absolute: 0.4 10e3/uL (ref 0.0–0.5)
HCT: 39.5 % (ref 34.8–46.6)
HGB: 13.8 g/dL (ref 11.6–15.9)
LYMPH#: 1.4 10e3/uL (ref 0.9–3.3)
LYMPH%: 22.3 % (ref 14.0–48.0)
MCH: 29.1 pg (ref 26.0–34.0)
MCHC: 34.9 g/dL (ref 32.0–36.0)
MCV: 83 fL (ref 81–101)
MONO#: 0.6 10e3/uL (ref 0.1–0.9)
MONO%: 8.8 % (ref 0.0–13.0)
NEUT#: 4 10e3/uL (ref 1.5–6.5)
NEUT%: 62.3 % (ref 39.6–80.0)
Platelets: 244 10e3/uL (ref 145–400)
RBC: 4.75 10e6/uL (ref 3.70–5.32)
RDW: 13 % (ref 11.1–15.7)
WBC: 6.5 10e3/uL (ref 3.9–10.0)

## 2011-06-26 ENCOUNTER — Encounter (INDEPENDENT_AMBULATORY_CARE_PROVIDER_SITE_OTHER): Payer: Self-pay | Admitting: Surgery

## 2011-07-27 ENCOUNTER — Ambulatory Visit (INDEPENDENT_AMBULATORY_CARE_PROVIDER_SITE_OTHER): Payer: BC Managed Care – PPO | Admitting: Surgery

## 2011-07-27 ENCOUNTER — Encounter (INDEPENDENT_AMBULATORY_CARE_PROVIDER_SITE_OTHER): Payer: Self-pay | Admitting: Surgery

## 2011-07-27 VITALS — BP 112/72 | HR 72 | Temp 98.4°F | Resp 12 | Ht 67.0 in | Wt 147.0 lb

## 2011-07-27 DIAGNOSIS — Z853 Personal history of malignant neoplasm of breast: Secondary | ICD-10-CM

## 2011-07-27 NOTE — Progress Notes (Signed)
Patient returns for one year followup. She is 3 years out from bilateral mastectomy and reconstruction for stage II right breast cancer. She continues on femara. She has no complaints  Review of systems: Negative for chest wall mass, pain.  Exam: Both breasts surgically absent. Reconstructions intact. No masses. No evidence of axillary adenopathy bilaterally  Impression: Stage II right breast cancer ER positive PR positive status post bilateral mastectomy with reconstruction without signs of recurrence  Plan: Return to clinic one year. If her exam is stable she will be released from a followup.

## 2011-07-27 NOTE — Patient Instructions (Signed)
Follow up 1 year 

## 2011-08-03 LAB — DIFFERENTIAL
Basophils Absolute: 0.1
Eosinophils Absolute: 0.2
Eosinophils Relative: 3
Lymphs Abs: 1.1
Neutrophils Relative %: 71

## 2011-08-03 LAB — COMPREHENSIVE METABOLIC PANEL
ALT: 12
AST: 12
Albumin: 3.6
CO2: 31
Calcium: 9.1
Creatinine, Ser: 0.61
GFR calc Af Amer: >60
Sodium: 140
Total Protein: 6.3

## 2011-08-03 LAB — CBC
HCT: 42.2
MCV: 88.5
Platelets: 208
RDW: 12.8
WBC: 6.1

## 2011-08-08 LAB — DIFFERENTIAL
Basophils Absolute: 0
Basophils Relative: 1
Eosinophils Absolute: 0.1
Lymphocytes Relative: 12
Lymphs Abs: 0.5 — ABNORMAL LOW
Monocytes Relative: 7
Neutro Abs: 3.3

## 2011-08-08 LAB — URINALYSIS, ROUTINE W REFLEX MICROSCOPIC
Glucose, UA: NEGATIVE
Hgb urine dipstick: NEGATIVE
Ketones, ur: NEGATIVE
Protein, ur: NEGATIVE
Urobilinogen, UA: 0.2

## 2011-08-08 LAB — URINE CULTURE

## 2011-08-08 LAB — CBC
HCT: 43.7
Hemoglobin: 15.1 — ABNORMAL HIGH
MCV: 84.6
Platelets: 149 — ABNORMAL LOW
RDW: 12.3

## 2011-08-08 LAB — CULTURE, BLOOD (ROUTINE X 2)
Culture: NO GROWTH
Culture: NO GROWTH

## 2011-08-08 LAB — BASIC METABOLIC PANEL
BUN: 8
Chloride: 97
Glucose, Bld: 127 — ABNORMAL HIGH
Potassium: 3.9
Sodium: 136

## 2011-12-13 ENCOUNTER — Other Ambulatory Visit (HOSPITAL_BASED_OUTPATIENT_CLINIC_OR_DEPARTMENT_OTHER): Payer: BC Managed Care – PPO | Admitting: Lab

## 2011-12-13 ENCOUNTER — Encounter: Payer: Self-pay | Admitting: Hematology & Oncology

## 2011-12-13 ENCOUNTER — Ambulatory Visit (HOSPITAL_BASED_OUTPATIENT_CLINIC_OR_DEPARTMENT_OTHER): Payer: BC Managed Care – PPO | Admitting: Hematology & Oncology

## 2011-12-13 VITALS — BP 124/77 | HR 75 | Temp 98.2°F | Ht 67.0 in | Wt 152.0 lb

## 2011-12-13 DIAGNOSIS — Z170 Estrogen receptor positive status [ER+]: Secondary | ICD-10-CM

## 2011-12-13 DIAGNOSIS — C50919 Malignant neoplasm of unspecified site of unspecified female breast: Secondary | ICD-10-CM | POA: Insufficient documentation

## 2011-12-13 DIAGNOSIS — E559 Vitamin D deficiency, unspecified: Secondary | ICD-10-CM

## 2011-12-13 DIAGNOSIS — C50419 Malignant neoplasm of upper-outer quadrant of unspecified female breast: Secondary | ICD-10-CM

## 2011-12-13 LAB — CBC WITH DIFFERENTIAL (CANCER CENTER ONLY)
BASO#: 0.1 10*3/uL (ref 0.0–0.2)
EOS%: 6.2 % (ref 0.0–7.0)
Eosinophils Absolute: 0.5 10*3/uL (ref 0.0–0.5)
HCT: 42.1 % (ref 34.8–46.6)
HGB: 14.2 g/dL (ref 11.6–15.9)
LYMPH#: 1.5 10*3/uL (ref 0.9–3.3)
MONO#: 0.8 10*3/uL (ref 0.1–0.9)
NEUT#: 4.5 10*3/uL (ref 1.5–6.5)
NEUT%: 62.3 % (ref 39.6–80.0)
RBC: 4.94 10*6/uL (ref 3.70–5.32)
WBC: 7.2 10*3/uL (ref 3.9–10.0)

## 2011-12-13 MED ORDER — LETROZOLE 2.5 MG PO TABS: 2.5000 mg | ORAL_TABLET | Freq: Every day | ORAL | Status: DC

## 2011-12-13 NOTE — Progress Notes (Signed)
This office note has been dictated.

## 2011-12-14 LAB — COMPREHENSIVE METABOLIC PANEL
AST: 15 U/L (ref 0–37)
Albumin: 4.3 g/dL (ref 3.5–5.2)
BUN: 13 mg/dL (ref 6–23)
CO2: 27 mEq/L (ref 19–32)
Calcium: 9.6 mg/dL (ref 8.4–10.5)
Chloride: 103 mEq/L (ref 96–112)
Glucose, Bld: 74 mg/dL (ref 70–99)
Potassium: 3.9 mEq/L (ref 3.5–5.3)

## 2011-12-14 LAB — VITAMIN D 25 HYDROXY (VIT D DEFICIENCY, FRACTURES): Vit D, 25-Hydroxy: 70 ng/mL (ref 30–89)

## 2011-12-14 NOTE — Progress Notes (Signed)
CC:   Artist Pais, M.D. Holley Bouche, M.D. Thomas A. Cornett, M.D. 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  CURRENT THERAPY: 1. Femara 2.5 mg p.o. daily. 2. Zometa 4 mg IV each year.  INTERIM HISTORY:  Ms. Viscomi comes in for her follow-up.  She is doing great.  We last saw her back in August.  She has had no problems since then.  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  Again, this has not been an issue for her.  She has had no problems with fevers, sweats or chills.  She is doing well with the Femara.  She has not had any arthralgias.  She is staying very active.  There has been no change in bowel or bladder habits.  She has had no bleeding or bruising.  There has been no fever, sweats or chills.  PHYSICAL EXAMINATION:  General Appearance:  This is a well-developed, well-nourished white female in no obvious distress.  Vital Signs:  98.2, pulse 75, respiratory rate 20, blood pressure 124/77.  Weight is 152. Head and Neck Exam:  Shows a normocephalic, atraumatic skull.  There are no ocular or oral lesions.  There are no palpable cervical or supraclavicular lymph nodes.  Lungs:  Clear bilaterally.  Cardiac Exam: Regular rate and rhythm with a normal S1 and S2.  There are no murmurs, rubs or bruits.  Abdominal Exam:  Soft with good bowel sounds.  There is no palpable abdominal mass.  There is no palpable hepatosplenomegaly. Breast Exam: Shows bilateral breast reconstruction.  The reconstruction sites are intact.  There is no nodularity about the chest wall.  There is no bilateral axillary adenopathy.  Extremities:  Show no clubbing, cyanosis or edema.  Skin Exam:  Shows scattered neurofibromas.  LABORATORY STUDIES:  White cell count is 7.2, hemoglobin 14.2, hematocrit 42.1, platelet count 279.  IMPRESSION:  Ms. Kinker is a 56 year old white female with stage IIA infiltrating ductal carcinoma of the  right breast.  She is on Femara. She completed the chemotherapy back in December 2009.  She had 6 cycles of Taxotere/Cytoxan.  We will continue on the Femara.  We are checking vitamin D levels on her.  She is taking calcium and vitamin D.  We will go ahead and plan to see her back in 6 months.  At that point in time, we will go ahead and do Zometa.    ______________________________ Josph Macho, M.D. PRE/MEDQ  D:  12/13/2011  T:  12/14/2011  Job:  [id number]

## 2011-12-17 ENCOUNTER — Telehealth: Payer: Self-pay | Admitting: *Deleted

## 2011-12-17 NOTE — Telephone Encounter (Signed)
Message copied by Anselm Jungling on Mon Dec 17, 2011 12:00 PM ------      Message from: Arlan Organ R      Created: Thu Dec 13, 2011  6:12 PM       Call and tell her labs are ok.  pete

## 2011-12-17 NOTE — Telephone Encounter (Signed)
Called patient to let her know that her labwork was all good per dr. ennever 

## 2012-01-03 ENCOUNTER — Other Ambulatory Visit: Payer: Self-pay | Admitting: Hematology & Oncology

## 2012-01-03 ENCOUNTER — Other Ambulatory Visit: Payer: Self-pay | Admitting: *Deleted

## 2012-01-04 ENCOUNTER — Other Ambulatory Visit: Payer: Self-pay | Admitting: *Deleted

## 2012-01-04 DIAGNOSIS — C50919 Malignant neoplasm of unspecified site of unspecified female breast: Secondary | ICD-10-CM

## 2012-01-04 MED ORDER — LETROZOLE 2.5 MG PO TABS: 2.5000 mg | ORAL_TABLET | Freq: Every day | ORAL | Status: DC

## 2012-01-04 NOTE — Telephone Encounter (Signed)
Pt called stating she only received a month supply of generic letrozole when it was last filled and normally gets a years worth. Explained to her that it was most likely a mistake. Reviewed chart. This is a chronic med for the pt. New rx for 11 refill sent via eprescribe to [facility]. Pt aware.

## 2012-02-07 ENCOUNTER — Other Ambulatory Visit: Payer: Self-pay | Admitting: *Deleted

## 2012-02-07 NOTE — Telephone Encounter (Signed)
error 

## 2012-05-05 NOTE — Telephone Encounter (Signed)
Error

## 2012-06-10 ENCOUNTER — Other Ambulatory Visit: Payer: Self-pay | Admitting: Medical

## 2012-06-10 ENCOUNTER — Ambulatory Visit (HOSPITAL_BASED_OUTPATIENT_CLINIC_OR_DEPARTMENT_OTHER): Payer: BC Managed Care – PPO | Admitting: Medical

## 2012-06-10 ENCOUNTER — Other Ambulatory Visit (HOSPITAL_BASED_OUTPATIENT_CLINIC_OR_DEPARTMENT_OTHER): Payer: BC Managed Care – PPO | Admitting: Lab

## 2012-06-10 ENCOUNTER — Ambulatory Visit (HOSPITAL_BASED_OUTPATIENT_CLINIC_OR_DEPARTMENT_OTHER): Payer: BC Managed Care – PPO

## 2012-06-10 VITALS — BP 166/79 | HR 82 | Temp 97.2°F | Resp 20 | Ht 67.0 in | Wt 146.0 lb

## 2012-06-10 DIAGNOSIS — C50919 Malignant neoplasm of unspecified site of unspecified female breast: Secondary | ICD-10-CM

## 2012-06-10 DIAGNOSIS — Z170 Estrogen receptor positive status [ER+]: Secondary | ICD-10-CM

## 2012-06-10 DIAGNOSIS — M810 Age-related osteoporosis without current pathological fracture: Secondary | ICD-10-CM

## 2012-06-10 DIAGNOSIS — C50419 Malignant neoplasm of upper-outer quadrant of unspecified female breast: Secondary | ICD-10-CM

## 2012-06-10 LAB — CBC WITH DIFFERENTIAL (CANCER CENTER ONLY)
EOS%: 3.6 % (ref 0.0–7.0)
Eosinophils Absolute: 0.3 10*3/uL (ref 0.0–0.5)
LYMPH#: 1.7 10*3/uL (ref 0.9–3.3)
MCH: 28.8 pg (ref 26.0–34.0)
MCHC: 34 g/dL (ref 32.0–36.0)
MONO%: 8.1 % (ref 0.0–13.0)
NEUT#: 5.4 10*3/uL (ref 1.5–6.5)
Platelets: 266 10*3/uL (ref 145–400)
RBC: 5.03 10*6/uL (ref 3.70–5.32)

## 2012-06-10 MED ORDER — ZOLEDRONIC ACID 4 MG/100ML IV SOLN
4.0000 mg | Freq: Once | INTRAVENOUS | Status: AC
  Administered 2012-06-10: 4 mg via INTRAVENOUS
  Filled 2012-06-10: qty 100

## 2012-06-10 NOTE — Progress Notes (Signed)
Patient Name : Anna Barton, Anna Barton. MR #[id number] DOB: 01-Jan-1956 Encounter Date: 06/10/2012 Dictated by Eunice Blase, PA-C  Diagnosis: #1 stage II a (T2, N0, M0) ductal carcinoma of the right breast, ER positive/HER-2 negative. (S/P 6 cycles of Taxotere/Cytoxan back in December 2009).  Current therapy: #1 Femara 2.5 mg by mouth daily. #2 Zometa 4 mg IV once a year.  Interim history: Anna Barton comes in today for an office followup visit.  Her husband accompanies her.  Overall, Anna Barton reports, that she's been doing relatively well.  She reports, that she thinks another neurofibroma has appeared in the center of her breast, but wants it checked out to make sure.  She does see a dermatologist once a year.  She also continues to followup with her surgeon and plastic surgeon and gynecologist on a yearly basis as well.  She remains on the Femara without any issues.  She denies any headaches, any visual changes, any, bone or joint pain.  She denies any type of obvious, bleeding.  She denies any cough, chest pain, shortness of breath, any fevers, chills, or night sweats.  She continues to stay very active.  She has a good appetite.  She denies any nausea, vomiting, diarrhea, or constipation.  She denies any upper or lower extremity swelling.  She continues to receive Zometa once a year.  She denies any problems with any teeth or gum issues.  Review of Systems: Pt. Denies any changes in their vision, hearing, adenopathy, fevers, chills, nausea, vomiting, diarrhea, constipation, chest pain, shortness of breath, passing blood, passing out, blacking out,  any changes in skin, joints, neurologic or psychiatric except as noted.  Physical Exam: This is a pleasant, 56 year old, well-developed, well-nourished, white female, in no obvious distress Vitals: Temperature 97.8 degrees, pulse 82, respirations 20, blood pressure 166/79, weight 146 pounds HEENT reveals a normocephalic, atraumatic skull, no scleral  icterus, no oral lesions  Neck is supple without any cervical or supraclavicular adenopathy.  Lungs are clear to auscultation bilaterally. There are no wheezes, rales or rhonci Cardiac is regular rate and rhythm with a normal S1 and S2. There are no murmurs, rubs, or bruits.  Abdomen is soft with good bowel sounds there's a palpable mass. There is no palpable hepatosplenomegaly. There is no palpable fluid wave.  Musculoskeletal no tenderness of the spine, ribs, or hips.  Extremities there are no clubbing, cyanosis, or edema.  Skin no petechia, purpura or ecchymosis-+ small neurofibromas on her extremities/stomach and center of chest  Neurologic is nonfocal. Breast exam: Shows bilateral breast reconstruction.  No nodularity or b/l axillary adenopathy.   Laboratory Data: White count 8.2, hemoglobin 14.5, hematocrit 42.7, platelets 266,000 Last vitamin D level was within normal limits at 70 mg/ML  Current Outpatient Prescriptions on File Prior to Visit  Medication Sig Dispense Refill  . cholecalciferol (VITAMIN D) 1000 UNITS tablet Take 6,000 Units by mouth daily.       Marland Kitchen letrozole (FEMARA) 2.5 MG tablet Take 1 tablet (2.5 mg total) by mouth daily.  30 tablet  11  . Multiple Vitamins-Minerals (MULTIVITAMIN WITH MINERALS) tablet Take 1 tablet by mouth daily.        Assessment/Plan: This is a pleasant, 56 year old, female, with the following issues. #1 stage IIA infiltrating ductal carcinoma of the right breast.  She completed 6 cycles of Taxotere/Cytoxan.  Back in December of 2009  She is currently on the Femara 2.5 mg by mouth daily.  She is doing very, well.  We'll continue with  the Femara.  #2.  Supportive therapy-she will continue on Zometa 4 mg IV once a year.  #3.  She will continue on vitamin D and calcium.  #4.  Followup-Anna Barton will follow back up with [place] in 6 months, but before then should there be questions or concerns.

## 2012-06-10 NOTE — Patient Instructions (Signed)
Zoledronic Acid injection (Hypercalcemia, Oncology) What is this medicine? ZOLEDRONIC ACID (ZOE le dron ik AS id) lowers the amount of calcium loss from bone. It is used to treat too much calcium in your blood from cancer. It is also used to prevent complications of cancer that has spread to the bone. This medicine may be used for other purposes; ask your health care provider or pharmacist if you have questions. What should I tell my health care provider before I take this medicine? They need to know if you have any of these conditions: -aspirin-sensitive asthma -dental disease -kidney disease -an unusual or allergic reaction to zoledronic acid, other medicines, foods, dyes, or preservatives -pregnant or trying to get pregnant -breast-feeding How should I use this medicine? This medicine is for infusion into a vein. It is given by a health care professional in a hospital or clinic setting. Talk to your pediatrician regarding the use of this medicine in children. Special care may be needed. Overdosage: If you think you have taken too much of this medicine contact a poison control center or emergency room at once. NOTE: This medicine is only for you. Do not share this medicine with others. What if I miss a dose? It is important not to miss your dose. Call your doctor or health care professional if you are unable to keep an appointment. What may interact with this medicine? -certain antibiotics given by injection -NSAIDs, medicines for pain and inflammation, like ibuprofen or naproxen -some diuretics like bumetanide, furosemide -teriparatide -thalidomide This list may not describe all possible interactions. Give your health care provider a list of all the medicines, herbs, non-prescription drugs, or dietary supplements you use. Also tell them if you smoke, drink alcohol, or use illegal drugs. Some items may interact with your medicine. What should I watch for while using this medicine? Visit  your doctor or health care professional for regular checkups. It may be some time before you see the benefit from this medicine. Do not stop taking your medicine unless your doctor tells you to. Your doctor may order blood tests or other tests to see how you are doing. Women should inform their doctor if they wish to become pregnant or think they might be pregnant. There is a potential for serious side effects to an unborn child. Talk to your health care professional or pharmacist for more information. You should make sure that you get enough calcium and vitamin D while you are taking this medicine. Discuss the foods you eat and the vitamins you take with your health care professional. Some people who take this medicine have severe bone, joint, and/or muscle pain. This medicine may also increase your risk for a broken thigh bone. Tell your doctor right away if you have pain in your upper leg or groin. Tell your doctor if you have any pain that does not go away or that gets worse. What side effects may I notice from receiving this medicine? Side effects that you should report to your doctor or health care professional as soon as possible: -allergic reactions like skin rash, itching or hives, swelling of the face, lips, or tongue -anxiety, confusion, or depression -breathing problems -changes in vision -feeling faint or lightheaded, falls -jaw burning, cramping, pain -muscle cramps, stiffness, or weakness -trouble passing urine or change in the amount of urine Side effects that usually do not require medical attention (report to your doctor or health care professional if they continue or are bothersome): -bone, joint, or muscle pain -  fever -hair loss -irritation at site where injected -loss of appetite -nausea, vomiting -stomach upset -tired This list may not describe all possible side effects. Call your doctor for medical advice about side effects. You may report side effects to FDA at  1-800-FDA-1088. Where should I keep my medicine? This drug is given in a hospital or clinic and will not be stored at home. NOTE: This sheet is a summary. It may not cover all possible information. If you have questions about this medicine, talk to your doctor, pharmacist, or health care provider.  2012, [employer] Standard. (04/20/2011 9:06:58 AM) 

## 2012-06-11 LAB — COMPREHENSIVE METABOLIC PANEL
AST: 12 U/L (ref 0–37)
Albumin: 4.3 g/dL (ref 3.5–5.2)
Alkaline Phosphatase: 88 U/L (ref 39–117)
Potassium: 3.6 mEq/L (ref 3.5–5.3)
Sodium: 140 mEq/L (ref 135–145)
Total Protein: 7.2 g/dL (ref 6.0–8.3)

## 2012-06-12 ENCOUNTER — Other Ambulatory Visit: Payer: BC Managed Care – PPO | Admitting: Lab

## 2012-06-12 ENCOUNTER — Telehealth: Payer: Self-pay | Admitting: Hematology & Oncology

## 2012-06-12 ENCOUNTER — Ambulatory Visit: Payer: BC Managed Care – PPO

## 2012-06-12 ENCOUNTER — Ambulatory Visit: Payer: BC Managed Care – PPO | Admitting: Hematology & Oncology

## 2012-06-12 NOTE — Telephone Encounter (Addendum)
Message copied by Cathi Roan on Thu Jun 12, 2012 11:08 AM ------      Message from: Josph Macho      Created: Tue Jun 10, 2012  5:39 PM       Please call and let her know thatPete her lab work looks good.Anna Barton  06-12-12 11:10am  Called and spoke to patient regarding above MD note, S.[facility] LPN

## 2012-07-15 ENCOUNTER — Ambulatory Visit (INDEPENDENT_AMBULATORY_CARE_PROVIDER_SITE_OTHER): Payer: BC Managed Care – PPO | Admitting: Surgery

## 2012-08-11 ENCOUNTER — Ambulatory Visit (INDEPENDENT_AMBULATORY_CARE_PROVIDER_SITE_OTHER): Payer: BC Managed Care – PPO | Admitting: Surgery

## 2012-08-11 ENCOUNTER — Encounter (INDEPENDENT_AMBULATORY_CARE_PROVIDER_SITE_OTHER): Payer: Self-pay | Admitting: Surgery

## 2012-08-11 VITALS — BP 140/82 | HR 96 | Temp 98.4°F | Resp 16 | Ht 67.0 in | Wt 131.6 lb

## 2012-08-11 DIAGNOSIS — Z853 Personal history of malignant neoplasm of breast: Secondary | ICD-10-CM | POA: Insufficient documentation

## 2012-08-11 NOTE — Progress Notes (Signed)
Patient returns for one year followup. She is 3 years out from bilateral mastectomy and reconstruction for stage II right breast cancer. She continues on femara. She has no complaints  Review of systems: Negative for chest wall mass, pain.  Exam: Both breasts surgically absent. Reconstructions intact. No masses. No evidence of axillary adenopathy bilaterally  Impression: Stage II right breast cancer ER positive PR positive status post bilateral mastectomy with reconstruction without signs of recurrence  Plan: Return as needed.  Pt followed every 6 months by Dr Twanna Hy.

## 2012-08-11 NOTE — Patient Instructions (Signed)
Return as needed

## 2012-12-11 ENCOUNTER — Ambulatory Visit: Payer: BC Managed Care – PPO

## 2012-12-11 ENCOUNTER — Other Ambulatory Visit (HOSPITAL_BASED_OUTPATIENT_CLINIC_OR_DEPARTMENT_OTHER): Payer: BC Managed Care – PPO | Admitting: Lab

## 2012-12-11 ENCOUNTER — Ambulatory Visit (HOSPITAL_BASED_OUTPATIENT_CLINIC_OR_DEPARTMENT_OTHER): Payer: BC Managed Care – PPO | Admitting: Hematology & Oncology

## 2012-12-11 ENCOUNTER — Other Ambulatory Visit: Payer: BC Managed Care – PPO | Admitting: Lab

## 2012-12-11 ENCOUNTER — Ambulatory Visit: Payer: BC Managed Care – PPO | Admitting: Hematology & Oncology

## 2012-12-11 VITALS — BP 135/71 | HR 86 | Temp 98.3°F | Resp 16 | Ht 67.0 in | Wt 140.0 lb

## 2012-12-11 DIAGNOSIS — C50419 Malignant neoplasm of upper-outer quadrant of unspecified female breast: Secondary | ICD-10-CM

## 2012-12-11 DIAGNOSIS — Z170 Estrogen receptor positive status [ER+]: Secondary | ICD-10-CM

## 2012-12-11 DIAGNOSIS — C50919 Malignant neoplasm of unspecified site of unspecified female breast: Secondary | ICD-10-CM

## 2012-12-11 DIAGNOSIS — E559 Vitamin D deficiency, unspecified: Secondary | ICD-10-CM

## 2012-12-11 DIAGNOSIS — M810 Age-related osteoporosis without current pathological fracture: Secondary | ICD-10-CM

## 2012-12-11 DIAGNOSIS — F411 Generalized anxiety disorder: Secondary | ICD-10-CM

## 2012-12-11 LAB — CBC WITH DIFFERENTIAL (CANCER CENTER ONLY)
BASO%: 0.8 % (ref 0.0–2.0)
EOS%: 2.5 % (ref 0.0–7.0)
LYMPH%: 20.4 % (ref 14.0–48.0)
MCH: 28.7 pg (ref 26.0–34.0)
MCHC: 33.5 g/dL (ref 32.0–36.0)
MCV: 86 fL (ref 81–101)
MONO%: 6.9 % (ref 0.0–13.0)
Platelets: 266 10*3/uL (ref 145–400)
RDW: 13.3 % (ref 11.1–15.7)

## 2012-12-11 NOTE — Progress Notes (Signed)
No IV Zometa today per dr. Myna Hidalgo.  Patient will be due in august,.

## 2012-12-11 NOTE — Progress Notes (Signed)
This office note has been dictated.

## 2012-12-12 LAB — COMPREHENSIVE METABOLIC PANEL
ALT: 18 U/L (ref 0–35)
Alkaline Phosphatase: 73 U/L (ref 39–117)
Glucose, Bld: 119 mg/dL — ABNORMAL HIGH (ref 70–99)
Sodium: 137 mEq/L (ref 135–145)
Total Bilirubin: 0.9 mg/dL (ref 0.3–1.2)
Total Protein: 6.9 g/dL (ref 6.0–8.3)

## 2012-12-12 NOTE — Progress Notes (Signed)
CC:   Anna Barton, M.D.  DIAGNOSIS:  Stage IIA (T2 N0 M0) ductal carcinoma of the right breast.  CURRENT THERAPY: 1. Femara 2.5 mg p.o. daily. 2. Zometa 4 mg IV once a year.  INTERIM HISTORY:  Anna Barton comes in for her followup.  Unfortunately, she broke her right foot.  She did this falling off the bathroom bathtub ledge.  This was about 7 weeks ago.  She is in a walking boot.  This is getting better.  Otherwise, she is doing well.  She was having some issues with anxiety. She saw an endocrine medicine specialist.  She now is on a protocol which she says is making her feel better.  She also now is on Celexa. This also has made her feel better. She lost quite a bit weight but is gaining weight back.  Her thyroid from what she says has not been a problem.  She has had no issues with the neurofibromatosis.  She is taking her vitamin D and calcium.  When we last saw her, her vitamin D was 80.  Her last mammogram was a year ago.  PHYSICAL EXAMINATION:  General:  This is a well-developed, well- nourished white female in no obvious distress.  Vital signs:  Show temperature of 98.3, pulse 86, respiratory rate 16, blood pressure 135/71.  Weight is 140.  Head and neck:  Shows a normocephalic, atraumatic skull.  There are no ocular or oral lesions.  There are no palpable cervical or supraclavicular lymph nodes.  Lungs:  Clear bilaterally.  Cardiac:  Regular rate and rhythm with a normal S1, S2. There are no murmurs, rubs or bruits.  Breasts:  Shows left breast with no masses, edema or erythema.  There is no axillary adenopathy.  Right breast has an implant.  This is well seated.  There is no right axillary adenopathy.  Abdomen:  Soft with good bowel sounds.  There is no palpable abdominal mass.  There is no palpable hepatosplenomegaly. Extremities:  Shows the walking boot on the right foot.  Skin:  Shows the neurofibromas.  LABORATORY STUDIES:  White cell count is 7.1,  hemoglobin 14.2, hematocrit 42.4, platelet count 266.  IMPRESSION:  Anna Barton is a very nice 57 year old white female who looks a whole lot younger.  She has stage IIA ductal carcinoma of the right breast.  She is node negative.  She is ER positive and HER2 negative.  She completed chemotherapy with Cytoxan/Taxotere.  She completed this back in December of 2009.  She is on Femara.  She is doing well on Femara.  She will complete 5 years of Femara I think at the end of this year.  She gets the Zometa once a year.  She will have this done in August.  We will go ahead and plan to get her back to see [place] in another 6 months.    ______________________________ Josph Macho, M.D. PRE/MEDQ  D:  12/11/2012  T:  12/12/2012  Job:  [id number]

## 2012-12-31 ENCOUNTER — Encounter: Payer: Self-pay | Admitting: *Deleted

## 2013-01-07 ENCOUNTER — Encounter: Payer: Self-pay | Admitting: Internal Medicine

## 2013-01-14 ENCOUNTER — Other Ambulatory Visit: Payer: Self-pay | Admitting: *Deleted

## 2013-01-14 DIAGNOSIS — C50919 Malignant neoplasm of unspecified site of unspecified female breast: Secondary | ICD-10-CM

## 2013-01-14 MED ORDER — LETROZOLE 2.5 MG PO TABS: 2.5000 mg | ORAL_TABLET | Freq: Every day | ORAL | Status: DC

## 2013-01-14 NOTE — Telephone Encounter (Signed)
Received refill request from [facility] for Letrozole. This is a chronic med for the pt. Refilled via e-rx.

## 2013-01-22 ENCOUNTER — Encounter: Payer: Self-pay | Admitting: Internal Medicine

## 2013-02-18 ENCOUNTER — Ambulatory Visit (AMBULATORY_SURGERY_CENTER): Payer: BC Managed Care – PPO | Admitting: *Deleted

## 2013-02-18 ENCOUNTER — Encounter: Payer: Self-pay | Admitting: Internal Medicine

## 2013-02-18 VITALS — Ht 67.0 in | Wt 140.0 lb

## 2013-02-18 DIAGNOSIS — Z86010 Personal history of colonic polyps: Secondary | ICD-10-CM

## 2013-02-18 DIAGNOSIS — Z1211 Encounter for screening for malignant neoplasm of colon: Secondary | ICD-10-CM

## 2013-02-18 MED ORDER — MOVIPREP 100 G PO SOLR: 1.0000 | Freq: Once | ORAL | Status: DC

## 2013-02-18 NOTE — Progress Notes (Signed)
Patient denies allergy to eggs or soy products. Denies any complications with sedation or anesthesia.

## 2013-03-04 ENCOUNTER — Encounter: Payer: Self-pay | Admitting: Internal Medicine

## 2013-03-04 ENCOUNTER — Ambulatory Visit (AMBULATORY_SURGERY_CENTER): Payer: BC Managed Care – PPO | Admitting: Internal Medicine

## 2013-03-04 VITALS — BP 150/79 | HR 58 | Temp 97.6°F | Resp 22 | Ht 67.0 in | Wt 140.0 lb

## 2013-03-04 DIAGNOSIS — Z1211 Encounter for screening for malignant neoplasm of colon: Secondary | ICD-10-CM

## 2013-03-04 DIAGNOSIS — Z860100 Personal history of colon polyps, unspecified: Secondary | ICD-10-CM

## 2013-03-04 DIAGNOSIS — D126 Benign neoplasm of colon, unspecified: Secondary | ICD-10-CM

## 2013-03-04 MED ORDER — SODIUM CHLORIDE 0.9 % IV SOLN: 500.0000 mL | INTRAVENOUS | Status: DC

## 2013-03-04 NOTE — Patient Instructions (Addendum)
A handouts was given to your care partner on polyps.  You may resume your current medications today.  Please call if any questions or concerns.    YOU HAD AN ENDOSCOPIC PROCEDURE TODAY AT THE [facility]: Refer to the procedure report that was given to you for any specific questions about what was found during the examination.  If the procedure report does not answer your questions, please call your gastroenterologist to clarify.  If you requested that your care partner not be given the details of your procedure findings, then the procedure report has been included in a sealed envelope for you to review at your convenience later.  YOU SHOULD EXPECT: Some feelings of bloating in the abdomen. Passage of more gas than usual.  Walking can help get rid of the air that was put into your GI tract during the procedure and reduce the bloating. If you had a lower endoscopy (such as a colonoscopy or flexible sigmoidoscopy) you may notice spotting of blood in your stool or on the toilet paper. If you underwent a bowel prep for your procedure, then you may not have a normal bowel movement for a few days.  DIET: Your first meal following the procedure should be a light meal and then it is ok to progress to your normal diet.  A half-sandwich or bowl of soup is an example of a good first meal.  Heavy or fried foods are harder to digest and may make you feel nauseous or bloated.  Likewise meals heavy in dairy and vegetables can cause extra gas to form and this can also increase the bloating.  Drink plenty of fluids but you should avoid alcoholic beverages for 24 hours.  ACTIVITY: Your care partner should take you home directly after the procedure.  You should plan to take it easy, moving slowly for the rest of the day.  You can resume normal activity the day after the procedure however you should NOT DRIVE or use heavy machinery for 24 hours (because of the sedation medicines used during the test).     SYMPTOMS TO REPORT IMMEDIATELY: A gastroenterologist can be reached at any hour.  During normal business hours, 8:30 AM to 5:00 PM Monday through Friday, call [phone].  After hours and on weekends, please call the GI answering service at [phone] who will take a message and have the physician on call contact you.   Following lower endoscopy (colonoscopy or flexible sigmoidoscopy):  Excessive amounts of blood in the stool  Significant tenderness or worsening of abdominal pains  Swelling of the abdomen that is new, acute  Fever of 100F or higher    FOLLOW UP: If any biopsies were taken you will be contacted by phone or by letter within the next 1-3 weeks.  Call your gastroenterologist if you have not heard about the biopsies in 3 weeks.  Our staff will call the home number listed on your records the next business day following your procedure to check on you and address any questions or concerns that you may have at that time regarding the information given to you following your procedure. This is a courtesy call and so if there is no answer at the home number and we have not heard from you through the emergency physician on call, we will assume that you have returned to your regular daily activities without incident.  SIGNATURES/CONFIDENTIALITY: You and/or your care partner have signed paperwork which will be entered into your electronic medical record.  These signatures attest to the fact that that the information above on your After Visit Summary has been reviewed and is understood.  Full responsibility of the confidentiality of this discharge information lies with you and/or your care-partner.

## 2013-03-04 NOTE — Progress Notes (Signed)
Patient did not experience any of the following events: a burn prior to discharge; a fall within the facility; wrong site/side/patient/procedure/implant event; or a hospital transfer or hospital admission upon discharge from the facility. (G8907) Patient did not have preoperative order for IV antibiotic SSI prophylaxis. (G8918)  

## 2013-03-04 NOTE — Progress Notes (Signed)
No complaints noted in the recovery room. Maw   

## 2013-03-04 NOTE — Op Note (Signed)
[facility] [address] [place], [address]   COLONOSCOPY PROCEDURE REPORT  PATIENT: Anna Barton, Anna Barton  MR#: [id number] BIRTHDATE: 14-Aug-1956 , 56  yrs. old GENDER: Female ENDOSCOPIST: Hart Carwin, MD REFERRED BY:  Johny Blamer, M.D. , Dr Demetrius Charity.Ennever PROCEDURE DATE:  03/04/2013 PROCEDURE:   Colonoscopy with snare polypectomy ASA CLASS:   Class II INDICATIONS:Patient's family history of colon polyps, Patient's personal history of adenomatous colon polyps, and colon 2003, 2009- adenomatous polyp, mother with Polyposis, sibblingd with polyps. MEDICATIONS: MAC sedation, administered by CRNA and propofol (Diprivan) 350mg  IV  DESCRIPTION OF PROCEDURE:   After the risks and benefits and of the procedure were explained, informed consent was obtained.  A digital rectal exam revealed no abnormalities of the rectum.    The LB [id number]-[id number]  endoscope was introduced through the anus and advanced to the cecum, which was identified by both the appendix and ileocecal valve .  The quality of the prep was good, using MoviPrep .  The instrument was then slowly withdrawn as the colon was fully examined.     COLON FINDINGS: A polypoid shaped sessile polyp ranging between 5-55mm in size was found in the ascending colon.  A polypectomy was performed with a cold snare.  The resection was complete and the polyp tissue was completely retrieved.     Retroflexed views revealed no abnormalities.     The scope was then withdrawn from the patient and the procedure completed.  COMPLICATIONS: There were no complications. ENDOSCOPIC IMPRESSION: Sessile polyp ranging between 5-67mm in size was found in the ascending colon; polypectomy was performed with a cold snare  RECOMMENDATIONS: Await pathology results high fiber diet   REPEAT EXAM: In 5 year(s)  for Colonoscopy.  cc:  _______________________________ eSignedHart Carwin, MD 03/04/2013 10:27 AM     PATIENT  NAME:  Anna Barton, Anna Barton MR#: [id number]

## 2013-03-05 ENCOUNTER — Telehealth: Payer: Self-pay

## 2013-03-05 NOTE — Telephone Encounter (Signed)
  Follow up Call-  Call back number 03/04/2013  Post procedure Call Back phone  # [phone]  Permission to leave phone message Yes     Patient questions:  Do you have a fever, pain , or abdominal swelling? no Pain Score  0 *  Have you tolerated food without any problems? yes  Have you been able to return to your normal activities? yes  Do you have any questions about your discharge instructions: Diet   no Medications  no Follow up visit  no  Do you have questions or concerns about your Care? no  Actions: * If pain score is 4 or above: No action needed, pain <4.

## 2013-03-09 ENCOUNTER — Encounter: Payer: Self-pay | Admitting: Internal Medicine

## 2013-06-10 ENCOUNTER — Ambulatory Visit (HOSPITAL_BASED_OUTPATIENT_CLINIC_OR_DEPARTMENT_OTHER): Payer: BC Managed Care – PPO

## 2013-06-10 ENCOUNTER — Ambulatory Visit (HOSPITAL_BASED_OUTPATIENT_CLINIC_OR_DEPARTMENT_OTHER): Payer: BC Managed Care – PPO | Admitting: Hematology & Oncology

## 2013-06-10 ENCOUNTER — Other Ambulatory Visit (HOSPITAL_BASED_OUTPATIENT_CLINIC_OR_DEPARTMENT_OTHER): Payer: BC Managed Care – PPO | Admitting: Lab

## 2013-06-10 VITALS — BP 134/60 | HR 77 | Temp 98.1°F | Resp 18 | Ht 67.0 in | Wt 142.0 lb

## 2013-06-10 DIAGNOSIS — M810 Age-related osteoporosis without current pathological fracture: Secondary | ICD-10-CM

## 2013-06-10 DIAGNOSIS — C50419 Malignant neoplasm of upper-outer quadrant of unspecified female breast: Secondary | ICD-10-CM

## 2013-06-10 DIAGNOSIS — C50911 Malignant neoplasm of unspecified site of right female breast: Secondary | ICD-10-CM

## 2013-06-10 DIAGNOSIS — Z170 Estrogen receptor positive status [ER+]: Secondary | ICD-10-CM

## 2013-06-10 DIAGNOSIS — C50919 Malignant neoplasm of unspecified site of unspecified female breast: Secondary | ICD-10-CM

## 2013-06-10 LAB — CBC WITH DIFFERENTIAL (CANCER CENTER ONLY)
BASO#: 0.1 10*3/uL (ref 0.0–0.2)
Eosinophils Absolute: 0.5 10*3/uL (ref 0.0–0.5)
HGB: 14.1 g/dL (ref 11.6–15.9)
LYMPH#: 1.6 10*3/uL (ref 0.9–3.3)
MCH: 29 pg (ref 26.0–34.0)
MONO#: 0.5 10*3/uL (ref 0.1–0.9)
MONO%: 6.8 % (ref 0.0–13.0)
NEUT#: 4.3 10*3/uL (ref 1.5–6.5)
RBC: 4.86 10*6/uL (ref 3.70–5.32)

## 2013-06-10 MED ORDER — ZOLEDRONIC ACID 4 MG/5ML IV CONC: 4.0000 mg | Freq: Once | INTRAVENOUS | Status: DC

## 2013-06-10 MED ORDER — ZOLEDRONIC ACID 4 MG/100ML IV SOLN
4.0000 mg | Freq: Once | INTRAVENOUS | Status: AC
  Administered 2013-06-10: 4 mg via INTRAVENOUS
  Filled 2013-06-10: qty 100

## 2013-06-10 MED ORDER — SODIUM CHLORIDE 0.9 % IV SOLN
Freq: Once | INTRAVENOUS | Status: AC
  Administered 2013-06-10: 15:00:00 via INTRAVENOUS

## 2013-06-10 NOTE — Progress Notes (Signed)
This office note has been dictated.

## 2013-06-10 NOTE — Patient Instructions (Signed)
Zoledronic Acid injection (Hypercalcemia, Oncology) What is this medicine? ZOLEDRONIC ACID (ZOE le dron ik AS id) lowers the amount of calcium loss from bone. It is used to treat too much calcium in your blood from cancer. It is also used to prevent complications of cancer that has spread to the bone. This medicine may be used for other purposes; ask your health care provider or pharmacist if you have questions. What should I tell my health care provider before I take this medicine? They need to know if you have any of these conditions: -aspirin-sensitive asthma -dental disease -kidney disease -an unusual or allergic reaction to zoledronic acid, other medicines, foods, dyes, or preservatives -pregnant or trying to get pregnant -breast-feeding How should I use this medicine? This medicine is for infusion into a vein. It is given by a health care professional in a hospital or clinic setting. Talk to your pediatrician regarding the use of this medicine in children. Special care may be needed. Overdosage: If you think you have taken too much of this medicine contact a poison control center or emergency room at once. NOTE: This medicine is only for you. Do not share this medicine with others. What if I miss a dose? It is important not to miss your dose. Call your doctor or health care professional if you are unable to keep an appointment. What may interact with this medicine? -certain antibiotics given by injection -NSAIDs, medicines for pain and inflammation, like ibuprofen or naproxen -some diuretics like bumetanide, furosemide -teriparatide -thalidomide This list may not describe all possible interactions. Give your health care provider a list of all the medicines, herbs, non-prescription drugs, or dietary supplements you use. Also tell them if you smoke, drink alcohol, or use illegal drugs. Some items may interact with your medicine. What should I watch for while using this medicine? Visit  your doctor or health care professional for regular checkups. It may be some time before you see the benefit from this medicine. Do not stop taking your medicine unless your doctor tells you to. Your doctor may order blood tests or other tests to see how you are doing. Women should inform their doctor if they wish to become pregnant or think they might be pregnant. There is a potential for serious side effects to an unborn child. Talk to your health care professional or pharmacist for more information. You should make sure that you get enough calcium and vitamin D while you are taking this medicine. Discuss the foods you eat and the vitamins you take with your health care professional. Some people who take this medicine have severe bone, joint, and/or muscle pain. This medicine may also increase your risk for a broken thigh bone. Tell your doctor right away if you have pain in your upper leg or groin. Tell your doctor if you have any pain that does not go away or that gets worse. What side effects may I notice from receiving this medicine? Side effects that you should report to your doctor or health care professional as soon as possible: -allergic reactions like skin rash, itching or hives, swelling of the face, lips, or tongue -anxiety, confusion, or depression -breathing problems -changes in vision -feeling faint or lightheaded, falls -jaw burning, cramping, pain -muscle cramps, stiffness, or weakness -trouble passing urine or change in the amount of urine Side effects that usually do not require medical attention (report to your doctor or health care professional if they continue or are bothersome): -bone, joint, or muscle pain -  fever -hair loss -irritation at site where injected -loss of appetite -nausea, vomiting -stomach upset -tired This list may not describe all possible side effects. Call your doctor for medical advice about side effects. You may report side effects to FDA at  1-800-FDA-1088. Where should I keep my medicine? This drug is given in a hospital or clinic and will not be stored at home. NOTE: This sheet is a summary. It may not cover all possible information. If you have questions about this medicine, talk to your doctor, pharmacist, or health care provider.  2012, [employer] Standard. (04/20/2011 9:06:58 AM) 

## 2013-06-11 LAB — COMPREHENSIVE METABOLIC PANEL
Albumin: 4.2 g/dL (ref 3.5–5.2)
BUN: 12 mg/dL (ref 6–23)
CO2: 29 mEq/L (ref 19–32)
Calcium: 9.2 mg/dL (ref 8.4–10.5)
Chloride: 104 mEq/L (ref 96–112)
Glucose, Bld: 129 mg/dL — ABNORMAL HIGH (ref 70–99)
Potassium: 3.8 mEq/L (ref 3.5–5.3)

## 2013-06-11 LAB — VITAMIN D 25 HYDROXY (VIT D DEFICIENCY, FRACTURES): Vit D, 25-Hydroxy: 78 ng/mL (ref 30–89)

## 2013-06-11 NOTE — Progress Notes (Signed)
CC:   Anna Barton, M.D.  DIAGNOSES: 1. 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  CURRENT THERAPY: 1. Femara 2.5 mg p.o. daily (patient to complete this in December of     2014). 2. Zometa 4 mg IV q. year.  INTERIM HISTORY:  Anna Barton comes in for her followup.  She is doing well.  She has had no problems since we last saw her.  When we last saw her back in February, she did have a walking boot.  She had broken her right foot.  This seems to have healed up quite nicely.  She really is on no new medications.  Apparently she has had no change in bowel or bladder habits.  She recently had a colonoscopy which looked okay.  She has no leg swelling.  There have been no issues with her neurofibromatosis.  She has had no headache.  There has been no double vision or blurred vision.  PHYSICAL EXAMINATION:  General:  This is a well-developed, well- nourished white female in no obvious distress.  Vital signs: Temperature of 98.1, pulse 77, respiratory rate 18, blood pressure 134/60.  Weight is 142.  Head and neck:  Normocephalic, atraumatic skull.  There are no ocular or oral lesions.  There are no palpable cervical or supraclavicular lymph nodes.  Lungs:  Clear bilaterally. Cardiac:  Regular rate and rhythm with a normal S1 and S2.  There are no murmurs, rubs or bruits.  Abdomen:  Soft.  She has good bowel sounds. There is no fluid wave.  There is no palpable hepatosplenomegaly. Breasts:  Shows bilateral mastectomies.  Her reconstruction sites are well-healed.  There is no bilateral axillary adenopathy.  Back:  Shows no tenderness over the spine, ribs, or hips.  Extremities:  Show no clubbing, cyanosis or edema.  Neurological:  Shows no focal neurological deficits.  Skin:  Does show the multiple neurofibromas.  LABORATORY STUDIES:  White cell count is 6.9, hemoglobin 14.1, hematocrit 42.1, platelet count 243.  IMPRESSION:  Anna Barton is a  57 year old white female with a history of stage IIA infiltrating ductal carcinoma of the right breast.  She is node negative.  She is ER positive.  She is HER2 negative.  She was treated with Cytoxan/Taxotere.  She tolerated this very well.  She completed this back in December of 2009.  She will complete 5 years of Femara in December.  After that, we can just follow her along.  We will see her back in 6 months' time.  She will get her Zometa today.    ______________________________ Josph Macho, M.D. PRE/MEDQ  D:  06/10/2013  T:  06/11/2013  Job:  [id number]

## 2013-09-03 ENCOUNTER — Encounter: Payer: Self-pay | Admitting: *Deleted

## 2013-09-03 NOTE — Progress Notes (Signed)
Pt called and asked if she needed to taper her Femara when she was finished.  Per Dr. Myna Hidalgo, no need to taper drug.  Just stop on 11/04/13.

## 2013-12-09 ENCOUNTER — Encounter: Payer: Self-pay | Admitting: Hematology & Oncology

## 2013-12-09 ENCOUNTER — Ambulatory Visit (HOSPITAL_BASED_OUTPATIENT_CLINIC_OR_DEPARTMENT_OTHER): Payer: BC Managed Care – PPO | Admitting: Hematology & Oncology

## 2013-12-09 ENCOUNTER — Other Ambulatory Visit (HOSPITAL_BASED_OUTPATIENT_CLINIC_OR_DEPARTMENT_OTHER): Payer: BC Managed Care – PPO | Admitting: Lab

## 2013-12-09 VITALS — BP 128/69 | HR 80 | Temp 98.5°F | Resp 14 | Ht 67.0 in | Wt 153.0 lb

## 2013-12-09 DIAGNOSIS — Z853 Personal history of malignant neoplasm of breast: Secondary | ICD-10-CM

## 2013-12-09 DIAGNOSIS — C50911 Malignant neoplasm of unspecified site of right female breast: Secondary | ICD-10-CM

## 2013-12-09 DIAGNOSIS — C50919 Malignant neoplasm of unspecified site of unspecified female breast: Secondary | ICD-10-CM

## 2013-12-09 DIAGNOSIS — M810 Age-related osteoporosis without current pathological fracture: Secondary | ICD-10-CM

## 2013-12-09 LAB — CBC WITH DIFFERENTIAL (CANCER CENTER ONLY)
BASO#: 0.1 10*3/uL (ref 0.0–0.2)
BASO%: 0.9 % (ref 0.0–2.0)
EOS%: 5.9 % (ref 0.0–7.0)
Eosinophils Absolute: 0.4 10*3/uL (ref 0.0–0.5)
HCT: 42.2 % (ref 34.8–46.6)
HGB: 14 g/dL (ref 11.6–15.9)
LYMPH#: 1.8 10*3/uL (ref 0.9–3.3)
LYMPH%: 27.4 % (ref 14.0–48.0)
MCH: 28.2 pg (ref 26.0–34.0)
MCHC: 33.2 g/dL (ref 32.0–36.0)
MCV: 85 fL (ref 81–101)
MONO#: 0.5 10*3/uL (ref 0.1–0.9)
MONO%: 8.2 % (ref 0.0–13.0)
NEUT%: 57.6 % (ref 39.6–80.0)
NEUTROS ABS: 3.8 10*3/uL (ref 1.5–6.5)
PLATELETS: 271 10*3/uL (ref 145–400)
RBC: 4.96 10*6/uL (ref 3.70–5.32)
RDW: 13.6 % (ref 11.1–15.7)
WBC: 6.6 10*3/uL (ref 3.9–10.0)

## 2013-12-09 NOTE — Progress Notes (Signed)
This office note has been dictated.

## 2013-12-10 ENCOUNTER — Telehealth: Payer: Self-pay | Admitting: *Deleted

## 2013-12-10 LAB — COMPREHENSIVE METABOLIC PANEL
ALT: 14 U/L (ref 0–35)
AST: 13 U/L (ref 0–37)
Albumin: 4.2 g/dL (ref 3.5–5.2)
Alkaline Phosphatase: 82 U/L (ref 39–117)
BUN: 11 mg/dL (ref 6–23)
CALCIUM: 9.8 mg/dL (ref 8.4–10.5)
CHLORIDE: 101 meq/L (ref 96–112)
CO2: 31 mEq/L (ref 19–32)
CREATININE: 0.61 mg/dL (ref 0.50–1.10)
Glucose, Bld: 112 mg/dL — ABNORMAL HIGH (ref 70–99)
Potassium: 3.8 mEq/L (ref 3.5–5.3)
SODIUM: 138 meq/L (ref 135–145)
Total Bilirubin: 0.4 mg/dL (ref 0.2–1.2)
Total Protein: 7.1 g/dL (ref 6.0–8.3)

## 2013-12-10 LAB — VITAMIN D 25 HYDROXY (VIT D DEFICIENCY, FRACTURES): VIT D 25 HYDROXY: 62 ng/mL (ref 30–89)

## 2013-12-10 NOTE — Telephone Encounter (Signed)
Message copied by Rico Ala on Thu Dec 10, 2013 12:35 PM ------      Message from: Burney Gauze R      Created: Thu Dec 10, 2013  6:22 AM       Call - labs and vit D are great!!!  Keep exercising!!!  Laurey Arrow ------

## 2013-12-10 NOTE — Telephone Encounter (Signed)
Called patient to let her know that her labwork and vitamin d are very good per dr. Marin Olp

## 2013-12-10 NOTE — Progress Notes (Signed)
DIAGNOSIS:  Stage IIa (T2 N0 M0) ductal carcinoma of the right breast.  CURRENT THERAPY:  Zometa 4 mg IV every year-given every June.  INTERIM HISTORY:  Ms. Pricer comes in for followup.  She is doing quite well.  She is off Femara now.  She completed 5 years in December.  She is on vitamin D.  She is also on aspirin.  She feels well.  She has had no problems since we last saw her.  She is exercising.  She has gained some weight since we last saw her, which I find it hard to believe.  When we last saw her, her vitamin D level was 78.  She has had no problems with bowels or bladder.  She has had no nausea or vomiting.  She has had no issues with the flu.  She has had bilateral mastectomy, so there is no indication for a mammograms.  She has had no rashes.  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  This has not been much of a problem for her.  PHYSICAL EXAMINATION:  General:  This is a well-developed, well- nourished white female, in no obvious distress.  Vital Signs: Temperature of 98.5, pulse 80, respiratory rate 14, blood pressure 128/69, weight is 153 pounds.  Head and Neck:  Normocephalic, atraumatic skull.  There are no ocular or oral lesions.  There are no palpable cervical or supraclavicular lymph nodes.  Lungs:  Clear bilaterally. Cardiac:  Regular rate and rhythm with a normal S1, S2.  There are no murmurs, rubs, or bruits.  Abdomen:  Soft.  She has good bowel sounds. There is no fluid wave.  There is no palpable abdominal mass.  There is no palpable hepatosplenomegaly.  Breasts:  Bilateral mastectomies.  She has reconstruction of both breasts, which looked well-healed.  They look very natural.  There is no bilateral axillary adenopathy.  Back:  No tenderness over the spine, ribs, or hips.  She has no kyphosis or osteoporotic changes.  Extremities:  No clubbing, cyanosis, or edema. Neurological:  No focal neurological deficits.  LABORATORY STUDIES:  White cell count is 6.8,  hemoglobin 14, hematocrit 42.2, platelet count is 271.  MCV is 85.  IMPRESSION:  Ms. Steuart is a very charming 58 year old white female. She is postmenopausal.  She now is over 5 years out from treatment with chemotherapy.  She was given Cytoxan/Taxotere.  Her tumor was ER positive and HER2 negative.  She is on Femara and completed this in December.  For now, we will plan to get her back in 6 more months.  I think this is definitely a reasonable amount of time for followup.  She has been on Zometa.  I really think if I need to get another bone density test on her.  I am not sure when her last one was, but somehow I do not think she has had one for a while.  Again, I will see her back in another 6 months.    ______________________________ [name], M.D. PRE/MEDQ  D:  12/09/2013  T:  12/10/2013  Job:  [id number]

## 2013-12-10 NOTE — Telephone Encounter (Signed)
Called patient to let her know that her labs and vitamin d levels are great per. Dr. Marin Olp

## 2013-12-15 ENCOUNTER — Other Ambulatory Visit: Payer: Self-pay | Admitting: Hematology & Oncology

## 2013-12-15 DIAGNOSIS — M810 Age-related osteoporosis without current pathological fracture: Secondary | ICD-10-CM

## 2013-12-17 ENCOUNTER — Telehealth: Payer: Self-pay | Admitting: Hematology & Oncology

## 2013-12-17 NOTE — Telephone Encounter (Signed)
Pt will call back to schedule bone density test

## 2013-12-25 ENCOUNTER — Ambulatory Visit
Admission: RE | Admit: 2013-12-25 | Discharge: 2013-12-25 | Disposition: A | Discharge status: Home / Self Care | Payer: BC Managed Care – PPO | Source: Ambulatory Visit | Attending: Hematology & Oncology | Admitting: Hematology & Oncology

## 2013-12-25 DIAGNOSIS — M810 Age-related osteoporosis without current pathological fracture: Secondary | ICD-10-CM

## 2014-01-22 ENCOUNTER — Other Ambulatory Visit: Payer: Self-pay | Admitting: *Deleted

## 2014-01-22 MED ORDER — IBANDRONATE SODIUM 150 MG PO TABS: 150.0000 mg | ORAL_TABLET | ORAL | Status: DC

## 2014-01-25 ENCOUNTER — Encounter: Payer: Self-pay | Admitting: Nurse Practitioner

## 2014-01-25 NOTE — Progress Notes (Signed)
Pt called to confirm that she was supposed to be taking the new RX that was called in for Boniva. She stated her GYN instructed her to take 1200mg  of Ca. Per Dr. Marin Olp she should be taking the Boniva, Ca supplement, as well as a Vit D. Supplement. Pt verbalized understanding and appreciation.

## 2014-01-28 ENCOUNTER — Telehealth: Payer: Self-pay | Admitting: Hematology & Oncology

## 2014-01-28 NOTE — Telephone Encounter (Signed)
Bone scan results have wrong dob. She said Juliann Pulse is the one to fix this and she will give her message to call. Nikki RN aware

## 2014-02-03 ENCOUNTER — Encounter: Payer: Self-pay | Admitting: Hematology & Oncology

## 2014-06-09 ENCOUNTER — Encounter: Payer: Self-pay | Admitting: Hematology & Oncology

## 2014-06-09 ENCOUNTER — Other Ambulatory Visit (HOSPITAL_BASED_OUTPATIENT_CLINIC_OR_DEPARTMENT_OTHER): Payer: BC Managed Care – PPO | Admitting: Lab

## 2014-06-09 ENCOUNTER — Ambulatory Visit (HOSPITAL_BASED_OUTPATIENT_CLINIC_OR_DEPARTMENT_OTHER): Payer: BC Managed Care – PPO | Admitting: Hematology & Oncology

## 2014-06-09 VITALS — BP 134/61 | HR 76 | Temp 98.4°F | Resp 14 | Ht 67.0 in | Wt 154.0 lb

## 2014-06-09 DIAGNOSIS — C50919 Malignant neoplasm of unspecified site of unspecified female breast: Secondary | ICD-10-CM

## 2014-06-09 DIAGNOSIS — M810 Age-related osteoporosis without current pathological fracture: Secondary | ICD-10-CM

## 2014-06-09 DIAGNOSIS — C50911 Malignant neoplasm of unspecified site of right female breast: Secondary | ICD-10-CM

## 2014-06-09 DIAGNOSIS — Z853 Personal history of malignant neoplasm of breast: Secondary | ICD-10-CM

## 2014-06-09 DIAGNOSIS — E559 Vitamin D deficiency, unspecified: Secondary | ICD-10-CM

## 2014-06-09 LAB — CBC WITH DIFFERENTIAL (CANCER CENTER ONLY)
BASO#: 0.1 10*3/uL (ref 0.0–0.2)
BASO%: 0.6 % (ref 0.0–2.0)
EOS%: 6.3 % (ref 0.0–7.0)
Eosinophils Absolute: 0.5 10*3/uL (ref 0.0–0.5)
HCT: 40.6 % (ref 34.8–46.6)
HGB: 13.9 g/dL (ref 11.6–15.9)
LYMPH#: 1.9 10*3/uL (ref 0.9–3.3)
LYMPH%: 23.6 % (ref 14.0–48.0)
MCH: 28.5 pg (ref 26.0–34.0)
MCHC: 34.2 g/dL (ref 32.0–36.0)
MCV: 83 fL (ref 81–101)
MONO#: 0.6 10*3/uL (ref 0.1–0.9)
MONO%: 7.7 % (ref 0.0–13.0)
NEUT%: 61.8 % (ref 39.6–80.0)
NEUTROS ABS: 4.9 10*3/uL (ref 1.5–6.5)
PLATELETS: 258 10*3/uL (ref 145–400)
RBC: 4.87 10*6/uL (ref 3.70–5.32)
RDW: 13 % (ref 11.1–15.7)
WBC: 8 10*3/uL (ref 3.9–10.0)

## 2014-06-09 LAB — COMPREHENSIVE METABOLIC PANEL
ALT: 13 U/L (ref 0–35)
AST: 13 U/L (ref 0–37)
Albumin: 4 g/dL (ref 3.5–5.2)
Alkaline Phosphatase: 65 U/L (ref 39–117)
BUN: 17 mg/dL (ref 6–23)
CALCIUM: 8.8 mg/dL (ref 8.4–10.5)
CHLORIDE: 102 meq/L (ref 96–112)
CO2: 26 mEq/L (ref 19–32)
Creatinine, Ser: 0.86 mg/dL (ref 0.50–1.10)
Glucose, Bld: 118 mg/dL — ABNORMAL HIGH (ref 70–99)
POTASSIUM: 3.7 meq/L (ref 3.5–5.3)
SODIUM: 136 meq/L (ref 135–145)
TOTAL PROTEIN: 6.5 g/dL (ref 6.0–8.3)
Total Bilirubin: 0.5 mg/dL (ref 0.2–1.2)

## 2014-06-09 NOTE — Progress Notes (Signed)
Hematology and Oncology Follow Up Visit  MAILYN STEICHEN [id number] August 29, 1956 58 y.o. 06/09/2014   Principle Diagnosis:  Stage IIa (T2 N0 M0) ductal carcinoma of the right breast. Neurofibromatosis  Current Therapy:    Observation     Interim History:  Ms.  Mcnaught is back for followup. We see her every 6 months. She is doing great. She's had no problems since we last saw her. She is working. She went down to [place] for vacation. She had a good time down there.  She's taking Boniva monthly. She was on Zometa but Jaclyn Prime is a lot easier for her.  She's had no problems with bowels or bladder. She's had no cough. She's had no nausea vomiting. There's been no rashes. The neurofibromatosis has not been a problem for her.  She has bilateral implants. These have been doing well.  Medications: Current outpatient prescriptions:aspirin 81 MG tablet, Take 81 mg by mouth daily., Disp: , Rfl: ;  CALCIUM & MAGNESIUM CARBONATES PO, Take by mouth every morning., Disp: , Rfl: ;  cholecalciferol (VITAMIN D) 1000 UNITS tablet, Take by mouth daily. TAKES 5,000 UNITS DAILY, Disp: , Rfl: ;  Cyanocobalamin (VITAMIN B-12) 2500 MCG SUBL, Place under the tongue every morning., Disp: , Rfl:  ibandronate (BONIVA) 150 MG tablet, Take 1 tablet (150 mg total) by mouth every 30 (thirty) days. Take in the morning with a full glass of water, on an empty stomach, and do not take anything else by mouth or lie down for the next 30 min., Disp: 12 tablet, Rfl: 0;  ibuprofen (ADVIL,MOTRIN) 800 MG tablet, Take 400 mg by mouth as needed. , Disp: , Rfl:   Allergies:  Allergies  Allergen Reactions  . Penicillins Anaphylaxis  . Codeine Nausea And Vomiting    Past Medical History, Surgical history, Social history, and Family History were reviewed and updated.  Review of Systems: As above  Physical Exam:  height is 5\' 7"  (1.702 m) and weight is 154 lb (69.854 kg). Her oral temperature is 98.4 F (36.9 C). Her blood pressure is  134/61 and her pulse is 76. Her respiration is 14.   1 [place] well-nourished white female. Head and neck exam shows no ocular or oral lesions. She is no palpable cervical or supraclavicular lymph nodes. Lungs are clear. Cardiac exam regular in rhythm with no murmurs rubs or bruits. Abdomen is soft. She has good bowel sounds. There is no fluid wave. There is no palpable liver or spleen tip. President Bush's bilateral implants. They are intact. No swelling or erythema is noted. There is no bilateral axillary adenopathy. Back exam no kyphosis or osteoporotic changes. Extremities shows no clubbing cyanosis or edema. She's good range of motion of her joints. No edema is noted in the right arm. Skin exam shows the neurofibromas. Neurological exam is nonfocal.  Lab Results  Component Value Date   WBC 8.0 06/09/2014   HGB 13.9 06/09/2014   HCT 40.6 06/09/2014   MCV 83 06/09/2014   PLT 258 06/09/2014     Chemistry      Component Value Date/Time   NA 136 06/09/2014 1438   K 3.7 06/09/2014 1438   CL 102 06/09/2014 1438   CO2 26 06/09/2014 1438   BUN 17 06/09/2014 1438   CREATININE 0.86 06/09/2014 1438      Component Value Date/Time   CALCIUM 8.8 06/09/2014 1438   ALKPHOS 65 06/09/2014 1438   AST 13 06/09/2014 1438   ALT 13 06/09/2014 1438  BILITOT 0.5 06/09/2014 1438         Impression and Plan: Ms. Skipper is 58 year old white female with a history of stage IIa ductal carcinoma right breast. She underwent systemic chemotherapy in the adjuvant setting. She will receive Cytoxan/Taxotere. She then received 5 years of Femara. She completed this in December of 2014.  She does have some osteo-porosis. She is on Boniva. We will check her vitamin D levels.  I'll see her back in 6 months.   [name], MD 8/5/20157:09 PM

## 2014-07-20 ENCOUNTER — Encounter: Payer: Self-pay | Admitting: Internal Medicine

## 2014-12-07 ENCOUNTER — Telehealth: Payer: Self-pay | Admitting: Hematology & Oncology

## 2014-12-07 NOTE — Telephone Encounter (Signed)
Patient called and cx 12/08/14 due to being sick.  She resch for 12/15/14

## 2014-12-08 ENCOUNTER — Ambulatory Visit: Payer: BC Managed Care – PPO | Admitting: Hematology & Oncology

## 2014-12-08 ENCOUNTER — Other Ambulatory Visit: Payer: BC Managed Care – PPO | Admitting: Lab

## 2014-12-15 ENCOUNTER — Other Ambulatory Visit (HOSPITAL_BASED_OUTPATIENT_CLINIC_OR_DEPARTMENT_OTHER): Payer: BLUE CROSS/BLUE SHIELD | Admitting: Lab

## 2014-12-15 ENCOUNTER — Encounter: Payer: Self-pay | Admitting: Hematology & Oncology

## 2014-12-15 ENCOUNTER — Ambulatory Visit (HOSPITAL_BASED_OUTPATIENT_CLINIC_OR_DEPARTMENT_OTHER): Payer: BLUE CROSS/BLUE SHIELD | Admitting: Hematology & Oncology

## 2014-12-15 VITALS — BP 136/64 | HR 81 | Temp 98.1°F | Resp 14 | Ht 67.0 in | Wt 153.0 lb

## 2014-12-15 DIAGNOSIS — Z853 Personal history of malignant neoplasm of breast: Secondary | ICD-10-CM

## 2014-12-15 DIAGNOSIS — C50911 Malignant neoplasm of unspecified site of right female breast: Secondary | ICD-10-CM

## 2014-12-15 DIAGNOSIS — M810 Age-related osteoporosis without current pathological fracture: Secondary | ICD-10-CM

## 2014-12-15 DIAGNOSIS — E559 Vitamin D deficiency, unspecified: Secondary | ICD-10-CM

## 2014-12-15 LAB — CBC WITH DIFFERENTIAL (CANCER CENTER ONLY)
BASO#: 0.1 10*3/uL (ref 0.0–0.2)
BASO%: 0.8 % (ref 0.0–2.0)
EOS%: 4.2 % (ref 0.0–7.0)
Eosinophils Absolute: 0.3 10*3/uL (ref 0.0–0.5)
HCT: 41.1 % (ref 34.8–46.6)
HGB: 13.7 g/dL (ref 11.6–15.9)
LYMPH#: 1.5 10*3/uL (ref 0.9–3.3)
LYMPH%: 21.4 % (ref 14.0–48.0)
MCH: 28 pg (ref 26.0–34.0)
MCHC: 33.3 g/dL (ref 32.0–36.0)
MCV: 84 fL (ref 81–101)
MONO#: 0.5 10*3/uL (ref 0.1–0.9)
MONO%: 7.6 % (ref 0.0–13.0)
NEUT%: 66 % (ref 39.6–80.0)
NEUTROS ABS: 4.7 10*3/uL (ref 1.5–6.5)
Platelets: 320 10*3/uL (ref 145–400)
RBC: 4.89 10*6/uL (ref 3.70–5.32)
RDW: 12.7 % (ref 11.1–15.7)
WBC: 7.1 10*3/uL (ref 3.9–10.0)

## 2014-12-15 LAB — CMP (CANCER CENTER ONLY)
ALK PHOS: 67 U/L (ref 26–84)
ALT(SGPT): 11 U/L (ref 10–47)
AST: 17 U/L (ref 11–38)
Albumin: 3.4 g/dL (ref 3.3–5.5)
BILIRUBIN TOTAL: 0.7 mg/dL (ref 0.20–1.60)
BUN, Bld: 11 mg/dL (ref 7–22)
CO2: 27 mEq/L (ref 18–33)
CREATININE: 0.7 mg/dL (ref 0.6–1.2)
Calcium: 8.7 mg/dL (ref 8.0–10.3)
Chloride: 99 mEq/L (ref 98–108)
Glucose, Bld: 122 mg/dL — ABNORMAL HIGH (ref 73–118)
Potassium: 3.9 mEq/L (ref 3.3–4.7)
SODIUM: 139 meq/L (ref 128–145)
TOTAL PROTEIN: 6.8 g/dL (ref 6.4–8.1)

## 2014-12-15 NOTE — Progress Notes (Signed)
Hematology and Oncology Follow Up Visit  Anna Barton [id number] 22-Jun-1956 59 y.o. 12/15/2014   Principle Diagnosis:  Stage IIa (T2 N0 M0) ductal carcinoma of the right breast. Neurofibromatosis  Current Therapy:    Observation     Interim History:  Anna Barton is back for followup. We see Anna Barton every 6 months. She is doing great. She's had no problems since we last saw Anna Barton. She is working. She enjoyed the holidays. She was with Anna Barton family. She may go back to [place] in March.  She's taking Boniva monthly. She was on Zometa but Jaclyn Prime is a lot easier for Anna Barton.  She's had no problems with bowels or bladder. She's had no cough. She's had no nausea vomiting. There's been no rashes. The neurofibromatosis has not been a problem for Anna Barton.  She's had no fever. There's been no bleeding. She's had no leg swelling. There's been no headache. There's been no visual issues.  She has bilateral implants. These have been doing well.  Medications:  Current outpatient prescriptions:  .  aspirin 81 MG tablet, Take 81 mg by mouth daily., Disp: , Rfl:  .  CALCIUM & MAGNESIUM CARBONATES PO, Apply topically every morning. , Disp: , Rfl:  .  cholecalciferol (VITAMIN D) 1000 UNITS tablet, Take by mouth daily. TAKES 5,000 UNITS DAILY, Disp: , Rfl:  .  Cyanocobalamin (VITAMIN B-12) 2500 MCG SUBL, Place under the tongue every morning., Disp: , Rfl:  .  ibandronate (BONIVA) 150 MG tablet, Take 1 tablet (150 mg total) by mouth every 30 (thirty) days. Take in the morning with a full glass of water, on an empty stomach, and do not take anything else by mouth or lie down for the next 30 min., Disp: 12 tablet, Rfl: 0 .  ibuprofen (ADVIL,MOTRIN) 800 MG tablet, Take 400 mg by mouth as needed. , Disp: , Rfl:   Allergies:  Allergies  Allergen Reactions  . Penicillins Anaphylaxis  . Codeine Nausea And Vomiting    Past Medical History, Surgical history, Social history, and Family History were reviewed and  updated.  Review of Systems: As above  Physical Exam:  height is 5\' 7"  (1.702 m) and weight is 153 lb (69.4 kg). Anna Barton oral temperature is 98.1 F (36.7 C). Anna Barton blood pressure is 136/64 and Anna Barton pulse is 81. Anna Barton respiration is 14.   1 [place] well-nourished white female. Head and neck exam shows no ocular or oral lesions. She is no palpable cervical or supraclavicular lymph nodes. Lungs are clear. Cardiac exam regular rate and rhythm with no murmurs rubs or bruits. Abdomen is soft. She has good bowel sounds. There is no fluid wave. There is no palpable liver or spleen tip. Chest wall exam shows bilateral implants. They are intact. No swelling or erythema is noted. There is no bilateral axillary adenopathy. Back exam shows no kyphosis or osteoporotic changes. Extremities shows no clubbing cyanosis or edema. Has good range of motion of Anna Barton joints. No edema is noted in the right arm. Skin exam shows the neurofibromas. Neurological exam is nonfocal.  Lab Results  Component Value Date   WBC 7.1 12/15/2014   HGB 13.7 12/15/2014   HCT 41.1 12/15/2014   MCV 84 12/15/2014   PLT 320 12/15/2014     Chemistry      Component Value Date/Time   NA 139 12/15/2014 1033   NA 136 06/09/2014 1438   K 3.9 12/15/2014 1033   K 3.7 06/09/2014 1438   CL 99 12/15/2014  1033   CL 102 06/09/2014 1438   CO2 27 12/15/2014 1033   CO2 26 06/09/2014 1438   BUN 11 12/15/2014 1033   BUN 17 06/09/2014 1438   CREATININE 0.7 12/15/2014 1033   CREATININE 0.86 06/09/2014 1438      Component Value Date/Time   CALCIUM 8.7 12/15/2014 1033   CALCIUM 8.8 06/09/2014 1438   ALKPHOS 67 12/15/2014 1033   ALKPHOS 65 06/09/2014 1438   AST 17 12/15/2014 1033   AST 13 06/09/2014 1438   ALT 11 12/15/2014 1033   ALT 13 06/09/2014 1438   BILITOT 0.70 12/15/2014 1033   BILITOT 0.5 06/09/2014 1438         Impression and Plan: Anna Barton is 59 year old white female with a history of stage IIa ductal carcinoma right breast. She  underwent systemic chemotherapy in the adjuvant setting. She did receive Cytoxan/Taxotere. She then received 5 years of Femara. She completed this in December of 2014.  She does have some osteoporosis. She is on Boniva. We always check Anna Barton vitamin D levels.  I'll see Anna Barton back in 6 months.   [name], MD 2/10/20165:15 PM

## 2014-12-16 ENCOUNTER — Telehealth: Payer: Self-pay | Admitting: Nurse Practitioner

## 2014-12-16 LAB — VITAMIN D 25 HYDROXY (VIT D DEFICIENCY, FRACTURES): Vit D, 25-Hydroxy: 48 ng/mL (ref 30–100)

## 2014-12-16 NOTE — Telephone Encounter (Addendum)
-----   Message from [name], MD sent at 12/16/2014 10:34 AM EST ----- Call - vit D level is ok!! Pete  LVM on pt's personal machine. Informed her to contact office with any further questions or concerns.

## 2015-06-15 ENCOUNTER — Other Ambulatory Visit (HOSPITAL_BASED_OUTPATIENT_CLINIC_OR_DEPARTMENT_OTHER): Payer: BLUE CROSS/BLUE SHIELD

## 2015-06-15 ENCOUNTER — Ambulatory Visit (HOSPITAL_BASED_OUTPATIENT_CLINIC_OR_DEPARTMENT_OTHER): Payer: BLUE CROSS/BLUE SHIELD | Admitting: Hematology & Oncology

## 2015-06-15 ENCOUNTER — Encounter: Payer: Self-pay | Admitting: Hematology & Oncology

## 2015-06-15 VITALS — BP 152/68 | HR 82 | Temp 98.0°F | Resp 20 | Ht 67.0 in | Wt 152.0 lb

## 2015-06-15 DIAGNOSIS — C50912 Malignant neoplasm of unspecified site of left female breast: Secondary | ICD-10-CM

## 2015-06-15 DIAGNOSIS — Z853 Personal history of malignant neoplasm of breast: Secondary | ICD-10-CM

## 2015-06-15 DIAGNOSIS — E559 Vitamin D deficiency, unspecified: Secondary | ICD-10-CM

## 2015-06-15 DIAGNOSIS — C50911 Malignant neoplasm of unspecified site of right female breast: Secondary | ICD-10-CM

## 2015-06-15 LAB — CBC WITH DIFFERENTIAL (CANCER CENTER ONLY)
BASO#: 0.1 10*3/uL (ref 0.0–0.2)
BASO%: 1.1 % (ref 0.0–2.0)
EOS%: 6.5 % (ref 0.0–7.0)
Eosinophils Absolute: 0.4 10*3/uL (ref 0.0–0.5)
HEMATOCRIT: 41.8 % (ref 34.8–46.6)
HGB: 14.1 g/dL (ref 11.6–15.9)
LYMPH#: 1.6 10*3/uL (ref 0.9–3.3)
LYMPH%: 25.1 % (ref 14.0–48.0)
MCH: 28.6 pg (ref 26.0–34.0)
MCHC: 33.7 g/dL (ref 32.0–36.0)
MCV: 85 fL (ref 81–101)
MONO#: 0.6 10*3/uL (ref 0.1–0.9)
MONO%: 9.8 % (ref 0.0–13.0)
NEUT#: 3.7 10*3/uL (ref 1.5–6.5)
NEUT%: 57.5 % (ref 39.6–80.0)
PLATELETS: 266 10*3/uL (ref 145–400)
RBC: 4.93 10*6/uL (ref 3.70–5.32)
RDW: 13.2 % (ref 11.1–15.7)
WBC: 6.5 10*3/uL (ref 3.9–10.0)

## 2015-06-15 LAB — COMPREHENSIVE METABOLIC PANEL (CC13)
ALBUMIN: 3.8 g/dL (ref 3.5–5.0)
ALK PHOS: 83 U/L (ref 40–150)
ALT: 14 U/L (ref 0–55)
AST: 10 U/L (ref 5–34)
Anion Gap: 6 mEq/L (ref 3–11)
BUN: 15.2 mg/dL (ref 7.0–26.0)
CALCIUM: 9.2 mg/dL (ref 8.4–10.4)
CHLORIDE: 106 meq/L (ref 98–109)
CO2: 28 mEq/L (ref 22–29)
CREATININE: 0.7 mg/dL (ref 0.6–1.1)
EGFR: >90 mL/min/{1.73_m2} (ref >90)
Glucose: 77 mg/dl (ref 70–140)
Potassium: 4 mEq/L (ref 3.5–5.1)
Sodium: 140 mEq/L (ref 136–145)
Total Bilirubin: 0.95 mg/dL (ref 0.20–1.20)
Total Protein: 6.5 g/dL (ref 6.4–8.3)

## 2015-06-15 NOTE — Progress Notes (Signed)
Hematology and Oncology Follow Up Visit  Anna Barton [id number] [birth year] 59 y.o. 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 is back for followup. We see her every 6 months. She is doing great. She's had no problems since we last saw her. She is working. Her kids are doing well. Her daughter discovered back from [place]. She is a little worried about her daughter having the Zika virus. I told her that I didn't think that that was going to be a problem.  Her son is still in college. He has one more year left. He is doing a internship this summer down in [place].  She is walking. She try and exercise. She is watching what she eats. She stopped taking Boniva. She wants to try to help her bones naturally. She is still on vitamin D.  Her neurofibromatosis has not caused any issues for her. She has not needed any skin lesions removed.  She does not need a mammogram as she has had bilateral mastectomies. She has implants.   Overall, her performance status is ECOG 0.  Medications:  Current outpatient prescriptions:  .  aspirin 81 MG tablet, Take 81 mg by mouth daily., Disp: , Rfl:  .  Cyanocobalamin (VITAMIN B-12) 2500 MCG SUBL, Place under the tongue every morning., Disp: , Rfl:  .  ibuprofen (ADVIL,MOTRIN) 800 MG tablet, Take 400 mg by mouth as needed. , Disp: , Rfl:  .  Magnesium 250 MG TABS, Take 1 tablet by mouth daily., Disp: , Rfl:  .  Multiple Minerals-Vitamins (CALCIUM & VIT D3 BONE HEALTH PO), Take 3 capsules by mouth daily., Disp: , Rfl:   Allergies:  Allergies  Allergen Reactions  . Penicillins Anaphylaxis  . Codeine Nausea And Vomiting    Past Medical History, Surgical history, Social history, and Family History were reviewed and updated.  Review of Systems: As above  Physical Exam:  height is 5\' 7"  (1.702 m) and weight is 152  lb (68.947 kg). Her oral temperature is 98 F (36.7 C). Her blood pressure is 152/68 and her pulse is 82. Her respiration is 20.   1 [place] well-nourished white female. Head and neck exam shows no ocular or oral lesions. She is no palpable cervical or supraclavicular lymph nodes. Lungs are clear. Cardiac exam regular rate and rhythm with no murmurs rubs or bruits. Abdomen is soft. She has good bowel sounds. There is no fluid wave. There is no palpable liver or spleen tip. Chest wall exam shows bilateral implants. They are intact. No swelling or erythema is noted. There is no bilateral axillary adenopathy. Back exam shows no kyphosis or osteoporotic changes. Extremities shows no clubbing cyanosis or edema. Has good range of motion of her joints. No edema is noted in the right arm. Skin exam shows the neurofibromas. Neurological exam is nonfocal.  Lab Results  Component Value Date   WBC 6.5 06/15/2015   HGB 14.1 06/15/2015   HCT 41.8 06/15/2015   MCV 85 06/15/2015   PLT 266 06/15/2015     Chemistry      Component Value Date/Time   NA 139 12/15/2014 1033   NA 136 06/09/2014 1438   K 3.9 12/15/2014 1033   K 3.7 06/09/2014 1438   CL 99 12/15/2014 1033   CL 102 06/09/2014 1438   CO2 27 12/15/2014 1033  CO2 26 06/09/2014 1438   BUN 11 12/15/2014 1033   BUN 17 06/09/2014 1438   CREATININE 0.7 12/15/2014 1033   CREATININE 0.86 06/09/2014 1438      Component Value Date/Time   CALCIUM 8.7 12/15/2014 1033   CALCIUM 8.8 06/09/2014 1438   ALKPHOS 67 12/15/2014 1033   ALKPHOS 65 06/09/2014 1438   AST 17 12/15/2014 1033   AST 13 06/09/2014 1438   ALT 11 12/15/2014 1033   ALT 13 06/09/2014 1438   BILITOT 0.70 12/15/2014 1033   BILITOT 0.5 06/09/2014 1438         Impression and Plan: Anna Barton is 59 year old white female with a history of stage IIa ductal carcinoma right breast. She underwent systemic chemotherapy in the adjuvant setting. She did receive Cytoxan/Taxotere. She then  received 5 years of Femara. She completed this in December of 2014.  For now, I will plan to get her back in 6 months.  We will check her vitamin D level.  At some point, she will need a bone density test.   [name], MD 8/10/201612:26 PM

## 2015-06-16 ENCOUNTER — Telehealth: Payer: Self-pay | Admitting: *Deleted

## 2015-06-16 LAB — VITAMIN D 25 HYDROXY (VIT D DEFICIENCY, FRACTURES): Vit D, 25-Hydroxy: 43 ng/mL (ref 30–100)

## 2015-06-16 NOTE — Telephone Encounter (Addendum)
Message left on personal voice mail  ----- Message from [name], MD sent at 06/16/2015 11:30 AM EDT ----- Please call her and tell her that the vitamin D level is okay. Anna Barton

## 2015-12-16 ENCOUNTER — Other Ambulatory Visit (HOSPITAL_BASED_OUTPATIENT_CLINIC_OR_DEPARTMENT_OTHER): Payer: BLUE CROSS/BLUE SHIELD

## 2015-12-16 ENCOUNTER — Encounter: Payer: Self-pay | Admitting: Hematology & Oncology

## 2015-12-16 ENCOUNTER — Ambulatory Visit (HOSPITAL_BASED_OUTPATIENT_CLINIC_OR_DEPARTMENT_OTHER): Payer: BLUE CROSS/BLUE SHIELD | Admitting: Hematology & Oncology

## 2015-12-16 DIAGNOSIS — E559 Vitamin D deficiency, unspecified: Secondary | ICD-10-CM

## 2015-12-16 DIAGNOSIS — M818 Other osteoporosis without current pathological fracture: Secondary | ICD-10-CM

## 2015-12-16 DIAGNOSIS — Z853 Personal history of malignant neoplasm of breast: Secondary | ICD-10-CM

## 2015-12-16 DIAGNOSIS — C50912 Malignant neoplasm of unspecified site of left female breast: Secondary | ICD-10-CM

## 2015-12-16 DIAGNOSIS — C50012 Malignant neoplasm of nipple and areola, left female breast: Secondary | ICD-10-CM

## 2015-12-16 DIAGNOSIS — T386X5A Adverse effect of antigonadotrophins, antiestrogens, antiandrogens, not elsewhere classified, initial encounter: Secondary | ICD-10-CM

## 2015-12-16 LAB — COMPREHENSIVE METABOLIC PANEL
ALK PHOS: 87 U/L (ref 40–150)
ALT: 12 U/L (ref 0–55)
ANION GAP: 9 meq/L (ref 3–11)
AST: 13 U/L (ref 5–34)
Albumin: 3.8 g/dL (ref 3.5–5.0)
BILIRUBIN TOTAL: 0.92 mg/dL (ref 0.20–1.20)
BUN: 11 mg/dL (ref 7.0–26.0)
CO2: 28 meq/L (ref 22–29)
Calcium: 9.6 mg/dL (ref 8.4–10.4)
Chloride: 104 mEq/L (ref 98–109)
Creatinine: 0.7 mg/dL (ref 0.6–1.1)
Glucose: 88 mg/dl (ref 70–140)
POTASSIUM: 4 meq/L (ref 3.5–5.1)
Sodium: 140 mEq/L (ref 136–145)
TOTAL PROTEIN: 6.9 g/dL (ref 6.4–8.3)

## 2015-12-16 LAB — CBC WITH DIFFERENTIAL (CANCER CENTER ONLY)
BASO#: 0.1 10*3/uL (ref 0.0–0.2)
BASO%: 1.2 % (ref 0.0–2.0)
EOS%: 5 % (ref 0.0–7.0)
Eosinophils Absolute: 0.3 10*3/uL (ref 0.0–0.5)
HCT: 42.3 % (ref 34.8–46.6)
HGB: 14.1 g/dL (ref 11.6–15.9)
LYMPH#: 1.4 10*3/uL (ref 0.9–3.3)
LYMPH%: 21.6 % (ref 14.0–48.0)
MCH: 28.1 pg (ref 26.0–34.0)
MCHC: 33.3 g/dL (ref 32.0–36.0)
MCV: 84 fL (ref 81–101)
MONO#: 0.6 10*3/uL (ref 0.1–0.9)
MONO%: 8.5 % (ref 0.0–13.0)
NEUT#: 4.2 10*3/uL (ref 1.5–6.5)
NEUT%: 63.7 % (ref 39.6–80.0)
PLATELETS: 296 10*3/uL (ref 145–400)
RBC: 5.01 10*6/uL (ref 3.70–5.32)
RDW: 13.1 % (ref 11.1–15.7)
WBC: 6.6 10*3/uL (ref 3.9–10.0)

## 2015-12-16 NOTE — Progress Notes (Signed)
Hematology and Oncology Follow Up Visit  JAVIANA LIBERATI [id number] 05-Sep-1956 60 y.o. 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  Michon is back for followup. She is doing quite well. Her daughter is getting married in June. Her son graduates from college in May. It will be very busy for her the first half the year.  She is still working. She enjoys work.  Healthwise, she is doing okay. 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 She now is on Celexa. This really helps her.  She's had no problems with nausea or vomiting. She's had no cough. She's had no fever.  She's had bilateral implants and these are doing okay. .   Overall, her performance status is ECOG 0.  Medications:  Current outpatient prescriptions:  .  aspirin 81 MG tablet, Take 81 mg by mouth daily., Disp: , Rfl:  .  citalopram (CELEXA) 20 MG tablet, , Disp: , Rfl: 5 .  Cyanocobalamin (VITAMIN B-12) 2500 MCG SUBL, Place under the tongue every morning., Disp: , Rfl:  .  ibuprofen (ADVIL,MOTRIN) 800 MG tablet, Take 400 mg by mouth as needed. , Disp: , Rfl:  .  Magnesium 250 MG TABS, Take 1 tablet by mouth daily., Disp: , Rfl:  .  Multiple Minerals-Vitamins (CALCIUM & VIT D3 BONE HEALTH PO), Take 3 capsules by mouth daily., Disp: , Rfl:   Allergies:  Allergies  Allergen Reactions  . Penicillins Anaphylaxis  . Codeine Nausea And Vomiting    Past Medical History, Surgical history, Social history, and Family History were reviewed and updated.  Review of Systems: As above  Physical Exam:  vitals were not taken for this visit.  1 [place] well-nourished white female. Head and neck exam shows no ocular or oral lesions. She is no palpable cervical or supraclavicular lymph nodes. Lungs are clear. Cardiac exam regular rate and rhythm with no murmurs rubs or bruits. Abdomen is soft. She has good bowel  sounds. There is no fluid wave. There is no palpable liver or spleen tip. Chest wall exam shows bilateral implants. They are intact. No swelling or erythema is noted. There is no bilateral axillary adenopathy. Back exam shows no kyphosis or osteoporotic changes. Extremities shows no clubbing cyanosis or edema. Has good range of motion of her joints. No edema is noted in the right arm. Skin exam shows the neurofibromas. Neurological exam is nonfocal.  Lab Results  Component Value Date   WBC 6.6 12/16/2015   HGB 14.1 12/16/2015   HCT 42.3 12/16/2015   MCV 84 12/16/2015   PLT 296 12/16/2015     Chemistry      Component Value Date/Time   NA 140 06/15/2015 1000   NA 139 12/15/2014 1033   NA 136 06/09/2014 1438   K 4.0 06/15/2015 1000   K 3.9 12/15/2014 1033   K 3.7 06/09/2014 1438   CL 99 12/15/2014 1033   CL 102 06/09/2014 1438   CO2 28 06/15/2015 1000   CO2 27 12/15/2014 1033   CO2 26 06/09/2014 1438   BUN 15.2 06/15/2015 1000   BUN 11 12/15/2014 1033   BUN 17 06/09/2014 1438   CREATININE 0.7 06/15/2015 1000   CREATININE 0.7 12/15/2014 1033   CREATININE 0.86 06/09/2014 1438      Component Value Date/Time   CALCIUM 9.2 06/15/2015 1000   CALCIUM 8.7 12/15/2014 1033  CALCIUM 8.8 06/09/2014 1438   ALKPHOS 83 06/15/2015 1000   ALKPHOS 67 12/15/2014 1033   ALKPHOS 65 06/09/2014 1438   AST 10 06/15/2015 1000   AST 17 12/15/2014 1033   AST 13 06/09/2014 1438   ALT 14 06/15/2015 1000   ALT 11 12/15/2014 1033   ALT 13 06/09/2014 1438   BILITOT 0.95 06/15/2015 1000   BILITOT 0.70 12/15/2014 1033   BILITOT 0.5 06/09/2014 1438         Impression and Plan: Ms. Aceituno is 60 year old white female with a history of stage IIa ductal carcinoma right breast. She underwent systemic chemotherapy in the adjuvant setting. She did receive Cytoxan/Taxotere. She then received 5 years of Femara. She completed this in December of 2014.  For now, I will plan to get her back in 6 months.  We  will check her vitamin D level.  At some point, she will need a bone density test.   [name], MD 2/10/201712:05 PM

## 2015-12-17 LAB — VITAMIN D 25 HYDROXY (VIT D DEFICIENCY, FRACTURES): VIT D 25 HYDROXY: 45.6 ng/mL (ref 30.0–100.0)

## 2015-12-19 ENCOUNTER — Telehealth: Payer: Self-pay | Admitting: *Deleted

## 2015-12-19 NOTE — Telephone Encounter (Addendum)
Patient is aware of results.   ----- Message from [name], MD sent at 12/19/2015  6:46 AM EST ----- Call - Vit D is great!!! Anna Barton

## 2016-04-12 DIAGNOSIS — F419 Anxiety disorder, unspecified: Secondary | ICD-10-CM | POA: Diagnosis not present

## 2016-06-15 ENCOUNTER — Other Ambulatory Visit (HOSPITAL_BASED_OUTPATIENT_CLINIC_OR_DEPARTMENT_OTHER): Payer: BLUE CROSS/BLUE SHIELD

## 2016-06-15 ENCOUNTER — Ambulatory Visit (HOSPITAL_BASED_OUTPATIENT_CLINIC_OR_DEPARTMENT_OTHER): Payer: BLUE CROSS/BLUE SHIELD | Admitting: Hematology & Oncology

## 2016-06-15 ENCOUNTER — Encounter: Payer: Self-pay | Admitting: Hematology & Oncology

## 2016-06-15 VITALS — BP 146/60 | HR 78 | Temp 98.1°F | Resp 20 | Ht 67.0 in | Wt 155.0 lb

## 2016-06-15 DIAGNOSIS — Q8500 Neurofibromatosis, unspecified: Secondary | ICD-10-CM

## 2016-06-15 DIAGNOSIS — M810 Age-related osteoporosis without current pathological fracture: Secondary | ICD-10-CM

## 2016-06-15 DIAGNOSIS — C50012 Malignant neoplasm of nipple and areola, left female breast: Secondary | ICD-10-CM

## 2016-06-15 DIAGNOSIS — M818 Other osteoporosis without current pathological fracture: Secondary | ICD-10-CM

## 2016-06-15 DIAGNOSIS — Z853 Personal history of malignant neoplasm of breast: Secondary | ICD-10-CM

## 2016-06-15 DIAGNOSIS — T386X5A Adverse effect of antigonadotrophins, antiestrogens, antiandrogens, not elsewhere classified, initial encounter: Secondary | ICD-10-CM

## 2016-06-15 DIAGNOSIS — C50919 Malignant neoplasm of unspecified site of unspecified female breast: Secondary | ICD-10-CM

## 2016-06-15 LAB — COMPREHENSIVE METABOLIC PANEL
ALBUMIN: 3.6 g/dL (ref 3.5–5.0)
ALK PHOS: 85 U/L (ref 40–150)
ALT: 13 U/L (ref 0–55)
AST: 13 U/L (ref 5–34)
Anion Gap: 8 mEq/L (ref 3–11)
BILIRUBIN TOTAL: 0.92 mg/dL (ref 0.20–1.20)
BUN: 10.3 mg/dL (ref 7.0–26.0)
CO2: 27 mEq/L (ref 22–29)
Calcium: 8.9 mg/dL (ref 8.4–10.4)
Chloride: 102 mEq/L (ref 98–109)
Creatinine: 0.8 mg/dL (ref 0.6–1.1)
EGFR: 83 mL/min/{1.73_m2} — AB (ref >90)
GLUCOSE: 138 mg/dL (ref 70–140)
Potassium: 3.9 mEq/L (ref 3.5–5.1)
SODIUM: 136 meq/L (ref 136–145)
TOTAL PROTEIN: 6.6 g/dL (ref 6.4–8.3)

## 2016-06-15 LAB — CBC WITH DIFFERENTIAL (CANCER CENTER ONLY)
BASO#: 0.1 10*3/uL (ref 0.0–0.2)
BASO%: 1.1 % (ref 0.0–2.0)
EOS%: 5.7 % (ref 0.0–7.0)
Eosinophils Absolute: 0.5 10*3/uL (ref 0.0–0.5)
HEMATOCRIT: 40.9 % (ref 34.8–46.6)
HGB: 13.9 g/dL (ref 11.6–15.9)
LYMPH#: 1.6 10*3/uL (ref 0.9–3.3)
LYMPH%: 18.8 % (ref 14.0–48.0)
MCH: 28.9 pg (ref 26.0–34.0)
MCHC: 34 g/dL (ref 32.0–36.0)
MCV: 85 fL (ref 81–101)
MONO#: 0.7 10*3/uL (ref 0.1–0.9)
MONO%: 8 % (ref 0.0–13.0)
NEUT#: 5.6 10*3/uL (ref 1.5–6.5)
NEUT%: 66.4 % (ref 39.6–80.0)
Platelets: 280 10*3/uL (ref 145–400)
RBC: 4.81 10*6/uL (ref 3.70–5.32)
RDW: 13.2 % (ref 11.1–15.7)
WBC: 8.4 10*3/uL (ref 3.9–10.0)

## 2016-06-15 NOTE — Progress Notes (Signed)
Hematology and Oncology Follow Up Visit  Anna Barton [id number] [birth year] 60 y.o. 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 is back for followup. She is doing quite well. Her daughter got married a couple months ago. This is very exciting for her. She is very happy for her daughter.  Her son is now working in [employer]. He graduated from college. Has a very nice job doing Runner, broadcasting/film/video.  She is doing a lot of exercising. She goes to classes every day. She really is happy that she is able to exercise and "age gracefully".   She's had no problems with cough or shortness of breath. There's been no change with bowel or bladder habits. She's had no rashes.   She's not noted any changes with the neurofibromatosis.   Overall, her performance status is ECOG 0.  Medications:  Current Outpatient Prescriptions:  .  aspirin 81 MG tablet, Take 81 mg by mouth daily., Disp: , Rfl:  .  Cholecalciferol (VITAMIN D3) 5000 units CAPS, Take by mouth., Disp: , Rfl:  .  citalopram (CELEXA) 20 MG tablet, , Disp: , Rfl: 5 .  ibuprofen (ADVIL,MOTRIN) 800 MG tablet, Take 400 mg by mouth as needed. , Disp: , Rfl:  .  Magnesium 250 MG TABS, Take 1 tablet by mouth daily., Disp: , Rfl:  .  UNABLE TO FIND, Magnesium oil, Disp: , Rfl:   Allergies:  Allergies  Allergen Reactions  . Penicillins Anaphylaxis  . Codeine Nausea And Vomiting    Past Medical History, Surgical history, Social history, and Family History were reviewed and updated.  Review of Systems: As above  Physical Exam:  height is 5\' 7"  (1.702 m) and weight is 155 lb (70.3 kg). Her oral temperature is 98.1 F (36.7 C). Her blood pressure is 146/60 (abnormal) and her pulse is 78. Her respiration is 20.   1 [place] well-nourished white female. Head and neck exam shows no ocular or oral  lesions. She is no palpable cervical or supraclavicular lymph nodes. Lungs are clear. Cardiac exam regular rate and rhythm with no murmurs rubs or bruits. Abdomen is soft. She has good bowel sounds. There is no fluid wave. There is no palpable liver or spleen tip. Chest wall exam shows bilateral implants. They are intact. No swelling or erythema is noted. There is no bilateral axillary adenopathy. Back exam shows no kyphosis or osteoporotic changes. Extremities shows no clubbing cyanosis or edema. Has good range of motion of her joints. No edema is noted in the right arm. Skin exam shows the neurofibromas. Neurological exam is nonfocal.  Lab Results  Component Value Date   WBC 8.4 06/15/2016   HGB 13.9 06/15/2016   HCT 40.9 06/15/2016   MCV 85 06/15/2016   PLT 280 06/15/2016     Chemistry      Component Value Date/Time   NA 140 12/16/2015 1021   K 4.0 12/16/2015 1021   CL 99 12/15/2014 1033   CO2 28 12/16/2015 1021   BUN 11.0 12/16/2015 1021   CREATININE 0.7 12/16/2015 1021      Component Value Date/Time   CALCIUM 9.6 12/16/2015 1021   ALKPHOS 87 12/16/2015 1021   AST 13 12/16/2015 1021   ALT 12 12/16/2015 1021   BILITOT 0.92 12/16/2015 1021         Impression and  Plan: Anna Barton is 60 year old white female with a history of stage IIa ductal carcinoma right breast. She underwent systemic chemotherapy in the adjuvant setting. She did receive Cytoxan/Taxotere. She then received 5 years of Femara. She completed this in December of 2014.  For now, I will plan to get her back in 6 months.  I do want to get a bone density test on her. I did this will be important.  We will check her vitamin D level.   [name], MD 8/11/201712:50 PM

## 2016-06-16 LAB — VITAMIN D 25 HYDROXY (VIT D DEFICIENCY, FRACTURES): Vitamin D, 25-Hydroxy: 43.4 ng/mL (ref 30.0–100.0)

## 2016-06-18 ENCOUNTER — Telehealth: Payer: Self-pay | Admitting: Emergency Medicine

## 2016-06-18 NOTE — Telephone Encounter (Signed)
Called patient and advised her that her Vitamin D level is good per Dr Marin Olp. No concerns or questions per patient. Advised her to call for any concerns and we will see her in Feb 2018.

## 2016-06-19 ENCOUNTER — Ambulatory Visit (HOSPITAL_BASED_OUTPATIENT_CLINIC_OR_DEPARTMENT_OTHER)
Admission: RE | Admit: 2016-06-19 | Discharge: 2016-06-19 | Disposition: A | Discharge status: Home / Self Care | Payer: BLUE CROSS/BLUE SHIELD | Source: Ambulatory Visit | Attending: Hematology & Oncology | Admitting: Hematology & Oncology

## 2016-06-19 ENCOUNTER — Inpatient Hospital Stay (HOSPITAL_BASED_OUTPATIENT_CLINIC_OR_DEPARTMENT_OTHER): Admission: RE | Admit: 2016-06-19 | Payer: BLUE CROSS/BLUE SHIELD | Source: Ambulatory Visit

## 2016-06-19 DIAGNOSIS — C50919 Malignant neoplasm of unspecified site of unspecified female breast: Secondary | ICD-10-CM

## 2016-06-19 DIAGNOSIS — M85851 Other specified disorders of bone density and structure, right thigh: Secondary | ICD-10-CM | POA: Diagnosis not present

## 2016-06-19 DIAGNOSIS — M810 Age-related osteoporosis without current pathological fracture: Secondary | ICD-10-CM | POA: Diagnosis not present

## 2016-06-19 DIAGNOSIS — M8588 Other specified disorders of bone density and structure, other site: Secondary | ICD-10-CM | POA: Diagnosis not present

## 2016-06-22 ENCOUNTER — Telehealth: Payer: Self-pay | Admitting: Nurse Practitioner

## 2016-06-22 NOTE — Telephone Encounter (Addendum)
Pt verbalized understanding and stated she would confirm later about Prolia. Will contact in the next 2 weeks or so. ----- Message from [name], MD sent at 06/20/2016  5:29 PM EDT ----- Call - the bones are osteopenic.  She really needs top be on something in addition to Vit D.  I think Prolia twice a year is reasonable with very little side effects!!  pete

## 2016-08-20 DIAGNOSIS — D225 Melanocytic nevi of trunk: Secondary | ICD-10-CM | POA: Diagnosis not present

## 2016-08-20 DIAGNOSIS — D1800 Hemangioma unspecified site: Secondary | ICD-10-CM | POA: Diagnosis not present

## 2016-08-20 DIAGNOSIS — Z86018 Personal history of other benign neoplasm: Secondary | ICD-10-CM | POA: Diagnosis not present

## 2016-08-20 DIAGNOSIS — L814 Other melanin hyperpigmentation: Secondary | ICD-10-CM | POA: Diagnosis not present

## 2016-12-04 DIAGNOSIS — Z6824 Body mass index (BMI) 24.0-24.9, adult: Secondary | ICD-10-CM | POA: Diagnosis not present

## 2016-12-04 DIAGNOSIS — Z01419 Encounter for gynecological examination (general) (routine) without abnormal findings: Secondary | ICD-10-CM | POA: Diagnosis not present

## 2016-12-04 DIAGNOSIS — Z130 Encounter for screening for diseases of the blood and blood-forming organs and certain disorders involving the immune mechanism: Secondary | ICD-10-CM | POA: Diagnosis not present

## 2016-12-04 DIAGNOSIS — Z1389 Encounter for screening for other disorder: Secondary | ICD-10-CM | POA: Diagnosis not present

## 2016-12-05 DIAGNOSIS — Z124 Encounter for screening for malignant neoplasm of cervix: Secondary | ICD-10-CM | POA: Diagnosis not present

## 2016-12-12 DIAGNOSIS — Z0000 Encounter for general adult medical examination without abnormal findings: Secondary | ICD-10-CM | POA: Diagnosis not present

## 2016-12-21 ENCOUNTER — Ambulatory Visit (HOSPITAL_BASED_OUTPATIENT_CLINIC_OR_DEPARTMENT_OTHER): Payer: BLUE CROSS/BLUE SHIELD | Admitting: Family

## 2016-12-21 ENCOUNTER — Other Ambulatory Visit (HOSPITAL_BASED_OUTPATIENT_CLINIC_OR_DEPARTMENT_OTHER): Payer: BLUE CROSS/BLUE SHIELD

## 2016-12-21 VITALS — BP 132/55 | HR 74 | Temp 98.1°F | Resp 18 | Ht 67.0 in | Wt 159.0 lb

## 2016-12-21 DIAGNOSIS — M818 Other osteoporosis without current pathological fracture: Secondary | ICD-10-CM | POA: Diagnosis not present

## 2016-12-21 DIAGNOSIS — M810 Age-related osteoporosis without current pathological fracture: Secondary | ICD-10-CM | POA: Diagnosis not present

## 2016-12-21 DIAGNOSIS — T386X5A Adverse effect of antigonadotrophins, antiestrogens, antiandrogens, not elsewhere classified, initial encounter: Secondary | ICD-10-CM

## 2016-12-21 DIAGNOSIS — C50919 Malignant neoplasm of unspecified site of unspecified female breast: Secondary | ICD-10-CM

## 2016-12-21 DIAGNOSIS — Z853 Personal history of malignant neoplasm of breast: Secondary | ICD-10-CM

## 2016-12-21 LAB — CBC WITH DIFFERENTIAL (CANCER CENTER ONLY)
BASO#: 0.1 10*3/uL (ref 0.0–0.2)
BASO%: 0.8 % (ref 0.0–2.0)
EOS ABS: 0.4 10*3/uL (ref 0.0–0.5)
EOS%: 3.9 % (ref 0.0–7.0)
HCT: 42.1 % (ref 34.8–46.6)
HEMOGLOBIN: 14.2 g/dL (ref 11.6–15.9)
LYMPH#: 1.9 10*3/uL (ref 0.9–3.3)
LYMPH%: 20.8 % (ref 14.0–48.0)
MCH: 28.7 pg (ref 26.0–34.0)
MCHC: 33.7 g/dL (ref 32.0–36.0)
MCV: 85 fL (ref 81–101)
MONO#: 0.7 10*3/uL (ref 0.1–0.9)
MONO%: 7.8 % (ref 0.0–13.0)
NEUT%: 66.7 % (ref 39.6–80.0)
NEUTROS ABS: 5.9 10*3/uL (ref 1.5–6.5)
Platelets: 271 10*3/uL (ref 145–400)
RBC: 4.94 10*6/uL (ref 3.70–5.32)
RDW: 13 % (ref 11.1–15.7)
WBC: 8.9 10*3/uL (ref 3.9–10.0)

## 2016-12-21 LAB — COMPREHENSIVE METABOLIC PANEL
ALBUMIN: 3.8 g/dL (ref 3.5–5.0)
ALK PHOS: 96 U/L (ref 40–150)
ALT: 11 U/L (ref 0–55)
ANION GAP: 9 meq/L (ref 3–11)
AST: 12 U/L (ref 5–34)
BILIRUBIN TOTAL: 0.8 mg/dL (ref 0.20–1.20)
BUN: 10.4 mg/dL (ref 7.0–26.0)
CO2: 26 meq/L (ref 22–29)
CREATININE: 0.7 mg/dL (ref 0.6–1.1)
Calcium: 9.2 mg/dL (ref 8.4–10.4)
Chloride: 105 mEq/L (ref 98–109)
EGFR: 88 mL/min/{1.73_m2} — ABNORMAL LOW (ref >90)
GLUCOSE: 78 mg/dL (ref 70–140)
Potassium: 3.7 mEq/L (ref 3.5–5.1)
SODIUM: 140 meq/L (ref 136–145)
TOTAL PROTEIN: 6.9 g/dL (ref 6.4–8.3)

## 2016-12-21 NOTE — Progress Notes (Signed)
Hematology and Oncology Follow Up Visit  Anna Barton [id number] 07-25-1956 61 y.o. 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 She is doing quite well and staying active going to the gym every day. She has no complaints at this time.  Bone density scan late last year show osteoporosis. We discussed trying Prolia every 6 months and went over possible side effects. She would like to give this a try.  Breast exam today was negative. No lymphadenopathy found on exam.  She has had no issue with infections. No fever, chills, n/v, cough, rash, dizziness, SOB, chest pain, palpitations, abdominal pain or changes in bowel or bladder habits.  No swelling, tenderness, numbness or tingling in her extremities. No c/o pain.  She has maintained a good appetite and is staying well hydrated. Her weight is stable.   Medications:  Allergies as of 12/21/2016      Reactions   Penicillins Anaphylaxis   Codeine Nausea And Vomiting      Medication List       Accurate as of 12/21/16  1:27 PM. Always use your most recent med list.          aspirin 81 MG tablet Take 81 mg by mouth daily.   citalopram 20 MG tablet Commonly known as:  CELEXA   ibuprofen 800 MG tablet Commonly known as:  ADVIL,MOTRIN Take 400 mg by mouth as needed.   UNABLE TO FIND Magnesium oil   Vitamin D3 5000 units Caps Take by mouth.       Allergies:  Allergies  Allergen Reactions  . Penicillins Anaphylaxis  . Codeine Nausea And Vomiting    Past Medical History, Surgical history, Social history, and Family History were reviewed and updated.  Review of Systems: All other 10 point review of systems is negative.   Physical Exam:  height is 5\' 7"  (1.702 m) and weight is 159 lb (72.1  kg). Her oral temperature is 98.1 F (36.7 C). Her blood pressure is 132/55 (abnormal) and her pulse is 74. Her respiration is 18 and oxygen saturation is 100%.   Wt Readings from Last 3 Encounters:  12/21/16 159 lb (72.1 kg)  06/15/16 155 lb (70.3 kg)  06/15/15 152 lb (68.9 kg)    Ocular: Sclerae unicteric, pupils equal, round and reactive to light Ear-nose-throat: Oropharynx clear, dentition fair Lymphatic: No cervical supraclavicular or axillary adenopathy Lungs no rales or rhonchi, good excursion bilaterally Heart regular rate and rhythm, no murmur appreciated Abd soft, nontender, positive bowel sounds, no liver or spleen tip palpated on exam, no fluid wave MSK no focal spinal tenderness, no joint edema Neuro: non-focal, well-oriented, appropriate affect Breasts: Breast exam negative. Bilateral mastectomy with reconstruction of both breasts. No rash, mass, lesion or lymphadenopathy found on exam.   Lab Results  Component Value Date   WBC 8.9 12/21/2016   HGB 14.2 12/21/2016   HCT 42.1 12/21/2016   MCV 85 12/21/2016   PLT 271 12/21/2016   No results found for: FERRITIN, IRON, TIBC, UIBC, IRONPCTSAT Lab Results  Component Value Date   RBC 4.94 12/21/2016   No results found for: KPAFRELGTCHN, LAMBDASER, KAPLAMBRATIO No results found for: IGGSERUM, IGA, IGMSERUM No results found for: TOTALPROTELP, ALBUMINELP, A1GS, A2GS, BETS, BETA2SER, [place], MSPIKE,  SPEI   Chemistry      Component Value Date/Time   NA 136 06/15/2016 1137   K 3.9 06/15/2016 1137   CL 99 12/15/2014 1033   CO2 27 06/15/2016 1137   BUN 10.3 06/15/2016 1137   CREATININE 0.8 06/15/2016 1137      Component Value Date/Time   CALCIUM 8.9 06/15/2016 1137   ALKPHOS 85 06/15/2016 1137   AST 13 06/15/2016 1137   ALT 13 06/15/2016 1137   BILITOT 0.92 06/15/2016 1137     Impression and Plan: Anna Barton is a very pleasant 61 yo white female with a history of stage IIa ductal carcinoma right breast with bilateral  mastectomy. She underwent systemic chemotherapy in the adjuvant setting with Cytoxan/Taxotere. She then completed 5 years of Femara in December of 2014. Since that time she has done quite well and so far there has been no evidence of recurrence.  She did have osteoporosis on her bone density scan last fall and had contemplated starting Prolia every 6 months. We discussed this today and went over side effects. She would like to start this if the cost is not too high. I have put in the order and Baxter Flattery is working on the [place]. She will contact the patient with the cost and also check into funding available though foundations to cut down the cost.  Once the cost has been determined and the patient states whether or not she wants to proceed we will schedule the injection appointment.  We will plan to see her back in 6 months for repeat lab work and follow-up.  She will contact our office with any questions or concerns. We can certainly see her sooner if need be.   Eliezer Bottom, NP 2/16/20181:27 PM

## 2016-12-22 LAB — VITAMIN D 25 HYDROXY (VIT D DEFICIENCY, FRACTURES): VIT D 25 HYDROXY: 47.5 ng/mL (ref 30.0–100.0)

## 2016-12-24 ENCOUNTER — Telehealth: Payer: Self-pay

## 2016-12-24 NOTE — Telephone Encounter (Signed)
Call - Vit d is ok!! Anna Barton   Above message given to pt via phone. dph

## 2017-05-27 DIAGNOSIS — F419 Anxiety disorder, unspecified: Secondary | ICD-10-CM | POA: Diagnosis not present

## 2017-06-21 ENCOUNTER — Ambulatory Visit (HOSPITAL_BASED_OUTPATIENT_CLINIC_OR_DEPARTMENT_OTHER): Payer: BLUE CROSS/BLUE SHIELD | Admitting: Hematology & Oncology

## 2017-06-21 ENCOUNTER — Other Ambulatory Visit (HOSPITAL_BASED_OUTPATIENT_CLINIC_OR_DEPARTMENT_OTHER): Payer: BLUE CROSS/BLUE SHIELD

## 2017-06-21 VITALS — BP 135/61 | HR 82 | Temp 98.6°F | Resp 20 | Wt 164.0 lb

## 2017-06-21 DIAGNOSIS — M818 Other osteoporosis without current pathological fracture: Secondary | ICD-10-CM

## 2017-06-21 DIAGNOSIS — Z170 Estrogen receptor positive status [ER+]: Secondary | ICD-10-CM

## 2017-06-21 DIAGNOSIS — Z853 Personal history of malignant neoplasm of breast: Secondary | ICD-10-CM

## 2017-06-21 DIAGNOSIS — C50611 Malignant neoplasm of axillary tail of right female breast: Secondary | ICD-10-CM

## 2017-06-21 DIAGNOSIS — T386X5A Adverse effect of antigonadotrophins, antiestrogens, antiandrogens, not elsewhere classified, initial encounter: Secondary | ICD-10-CM

## 2017-06-21 LAB — CBC WITH DIFFERENTIAL (CANCER CENTER ONLY)
BASO#: 0.1 10*3/uL (ref 0.0–0.2)
BASO%: 0.8 % (ref 0.0–2.0)
EOS ABS: 0.3 10*3/uL (ref 0.0–0.5)
EOS%: 4.2 % (ref 0.0–7.0)
HEMATOCRIT: 41.6 % (ref 34.8–46.6)
HEMOGLOBIN: 14 g/dL (ref 11.6–15.9)
LYMPH#: 1.6 10*3/uL (ref 0.9–3.3)
LYMPH%: 21.2 % (ref 14.0–48.0)
MCH: 28.7 pg (ref 26.0–34.0)
MCHC: 33.7 g/dL (ref 32.0–36.0)
MCV: 85 fL (ref 81–101)
MONO#: 0.6 10*3/uL (ref 0.1–0.9)
MONO%: 7.5 % (ref 0.0–13.0)
NEUT%: 66.3 % (ref 39.6–80.0)
NEUTROS ABS: 5 10*3/uL (ref 1.5–6.5)
Platelets: 294 10*3/uL (ref 145–400)
RBC: 4.88 10*6/uL (ref 3.70–5.32)
RDW: 13.1 % (ref 11.1–15.7)
WBC: 7.6 10*3/uL (ref 3.9–10.0)

## 2017-06-21 LAB — COMPREHENSIVE METABOLIC PANEL
ALK PHOS: 88 U/L (ref 40–150)
ALT: 14 U/L (ref 0–55)
ANION GAP: 7 meq/L (ref 3–11)
AST: 16 U/L (ref 5–34)
Albumin: 3.7 g/dL (ref 3.5–5.0)
BILIRUBIN TOTAL: 1.01 mg/dL (ref 0.20–1.20)
BUN: 10.5 mg/dL (ref 7.0–26.0)
CALCIUM: 9.5 mg/dL (ref 8.4–10.4)
CO2: 28 mEq/L (ref 22–29)
Chloride: 103 mEq/L (ref 98–109)
Creatinine: 0.8 mg/dL (ref 0.6–1.1)
EGFR: 84 mL/min/{1.73_m2} — AB (ref >90)
GLUCOSE: 118 mg/dL (ref 70–140)
Potassium: 4 mEq/L (ref 3.5–5.1)
SODIUM: 138 meq/L (ref 136–145)
TOTAL PROTEIN: 6.7 g/dL (ref 6.4–8.3)

## 2017-06-21 NOTE — Progress Notes (Signed)
Hematology and Oncology Follow Up Visit  SHAELEE FORNI [id number] [birth year] 61 y.o. 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  Raven is back for followup. She is doing quite well. She is now a grandmother. Her daughter just had a baby last week. Everybody is doing fine. Her granddaughter is very healthy.  Mrs. Gladman had her 61th birthday in June. In July, she and her husband went on a cruise to [place]. This was her first vacation in 20 years. He has been working very hard.   Their son is doing well. He is now married. He is living in [place].   Her health is doing fine area and she's had no health issues. She is exercising. She's having no problems with nausea or vomiting. There is no change in bowel or bladder habits.   She's not having problems with hot flashes.  She is a little bit lax on her vitamin D. She will start taking this more diligently.   She's not noted any changes with the neurofibromatosis.   Overall, her performance status is ECOG 0.  Medications:  Current Outpatient Prescriptions:  .  citalopram (CELEXA) 20 MG tablet, , Disp: , Rfl: 5 .  ibuprofen (ADVIL,MOTRIN) 800 MG tablet, Take 400 mg by mouth as needed. , Disp: , Rfl:  .  UNABLE TO FIND, Magnesium oil, Disp: , Rfl:  .  aspirin 81 MG tablet, Take 81 mg by mouth daily., Disp: , Rfl:  .  Cholecalciferol (VITAMIN D3) 5000 units CAPS, Take by mouth., Disp: , Rfl:   Allergies:  Allergies  Allergen Reactions  . Penicillins Anaphylaxis  . Codeine Nausea And Vomiting    Past Medical History, Surgical history, Social history, and Family History were reviewed and updated.  Review of Systems: As above  Physical Exam:  weight is 164 lb (74.4 kg). Her oral temperature is 98.6 F (37 C). Her blood pressure is 135/61 and her pulse is 82. Her respiration is 20 and  oxygen saturation is 97%.   1 [place] well-nourished white female. Head and neck exam shows no ocular or oral lesions. She is no palpable cervical or supraclavicular lymph nodes. Lungs are clear. Cardiac exam regular rate and rhythm with no murmurs rubs or bruits. Abdomen is soft. She has good bowel sounds. There is no fluid wave. There is no palpable liver or spleen tip. Chest wall exam shows bilateral implants. They are intact. No swelling or erythema is noted. There is no bilateral axillary adenopathy. Back exam shows no kyphosis or osteoporotic changes. Extremities shows no clubbing cyanosis or edema. Has good range of motion of her joints. No edema is noted in the right arm. Skin exam shows the neurofibromas. Neurological exam is nonfocal.  Lab Results  Component Value Date   WBC 7.6 06/21/2017   HGB 14.0 06/21/2017   HCT 41.6 06/21/2017   MCV 85 06/21/2017   PLT 294 06/21/2017     Chemistry      Component Value Date/Time   NA 140 12/21/2016 1148   K 3.7 12/21/2016 1148   CL 99 12/15/2014 1033   CO2 26 12/21/2016 1148   BUN 10.4 12/21/2016 1148   CREATININE 0.7 12/21/2016 1148      Component Value Date/Time   CALCIUM 9.2 12/21/2016 1148   ALKPHOS 96 12/21/2016 1148   AST 12 12/21/2016  1148   ALT 11 12/21/2016 1148   BILITOT 0.80 12/21/2016 1148         Impression and Plan: Ms. Hannis is 61 year old white female with a history of stage IIa ductal carcinoma right breast. She underwent systemic chemotherapy in the adjuvant setting. She did receive Cytoxan/Taxotere.She completed this in December 2009.   She received 5 years of Femara. She completed this in December of 2014.  For now, I will plan to get her back in 6 months.  Her last bone density test was back in 2017. It shows some osteopenia. She is on vitamin D.  I'm just so happy for her. Being a grandmother certainly agrees with her. She is enjoying the moment.   [name], MD 8/17/201812:55 PM

## 2017-06-22 LAB — VITAMIN D 25 HYDROXY (VIT D DEFICIENCY, FRACTURES): Vitamin D, 25-Hydroxy: 30.4 ng/mL (ref 30.0–100.0)

## 2017-08-19 DIAGNOSIS — D1800 Hemangioma unspecified site: Secondary | ICD-10-CM | POA: Diagnosis not present

## 2017-08-19 DIAGNOSIS — L814 Other melanin hyperpigmentation: Secondary | ICD-10-CM | POA: Diagnosis not present

## 2017-08-19 DIAGNOSIS — D225 Melanocytic nevi of trunk: Secondary | ICD-10-CM | POA: Diagnosis not present

## 2017-08-19 DIAGNOSIS — Z86018 Personal history of other benign neoplasm: Secondary | ICD-10-CM | POA: Diagnosis not present

## 2017-12-27 ENCOUNTER — Inpatient Hospital Stay: Payer: No Typology Code available for payment source | Attending: Hematology & Oncology

## 2017-12-27 ENCOUNTER — Inpatient Hospital Stay (HOSPITAL_BASED_OUTPATIENT_CLINIC_OR_DEPARTMENT_OTHER): Payer: No Typology Code available for payment source | Admitting: Hematology & Oncology

## 2017-12-27 ENCOUNTER — Other Ambulatory Visit: Payer: Self-pay

## 2017-12-27 VITALS — BP 146/73 | HR 78 | Temp 98.5°F | Resp 16 | Wt 161.0 lb

## 2017-12-27 DIAGNOSIS — Z79899 Other long term (current) drug therapy: Secondary | ICD-10-CM | POA: Diagnosis not present

## 2017-12-27 DIAGNOSIS — Z885 Allergy status to narcotic agent status: Secondary | ICD-10-CM | POA: Insufficient documentation

## 2017-12-27 DIAGNOSIS — Q8500 Neurofibromatosis, unspecified: Secondary | ICD-10-CM

## 2017-12-27 DIAGNOSIS — Z9223 Personal history of estrogen therapy: Secondary | ICD-10-CM | POA: Diagnosis not present

## 2017-12-27 DIAGNOSIS — Z170 Estrogen receptor positive status [ER+]: Secondary | ICD-10-CM

## 2017-12-27 DIAGNOSIS — Z880 Allergy status to penicillin: Secondary | ICD-10-CM | POA: Diagnosis not present

## 2017-12-27 DIAGNOSIS — Z9221 Personal history of antineoplastic chemotherapy: Secondary | ICD-10-CM | POA: Diagnosis not present

## 2017-12-27 DIAGNOSIS — T386X5A Adverse effect of antigonadotrophins, antiestrogens, antiandrogens, not elsewhere classified, initial encounter: Principal | ICD-10-CM

## 2017-12-27 DIAGNOSIS — Z853 Personal history of malignant neoplasm of breast: Secondary | ICD-10-CM | POA: Insufficient documentation

## 2017-12-27 DIAGNOSIS — Z7982 Long term (current) use of aspirin: Secondary | ICD-10-CM

## 2017-12-27 DIAGNOSIS — C50611 Malignant neoplasm of axillary tail of right female breast: Secondary | ICD-10-CM

## 2017-12-27 DIAGNOSIS — M818 Other osteoporosis without current pathological fracture: Secondary | ICD-10-CM

## 2017-12-27 LAB — CMP (CANCER CENTER ONLY)
ALT: 15 U/L (ref 0–55)
ANION GAP: 9 (ref 3–11)
AST: 13 U/L (ref 5–34)
Albumin: 3.8 g/dL (ref 3.5–5.0)
Alkaline Phosphatase: 104 U/L (ref 40–150)
BUN: 10 mg/dL (ref 7–26)
CO2: 27 mmol/L (ref 22–29)
CREATININE: 0.76 mg/dL (ref 0.60–1.10)
Calcium: 9.6 mg/dL (ref 8.4–10.4)
Chloride: 102 mmol/L (ref 98–109)
Glucose, Bld: 101 mg/dL (ref 70–140)
POTASSIUM: 4.3 mmol/L (ref 3.5–5.1)
SODIUM: 138 mmol/L (ref 136–145)
Total Bilirubin: 0.7 mg/dL (ref 0.2–1.2)
Total Protein: 7.3 g/dL (ref 6.4–8.3)

## 2017-12-27 LAB — CBC WITH DIFFERENTIAL (CANCER CENTER ONLY)
Basophils Absolute: 0.1 10*3/uL (ref 0.0–0.1)
Basophils Relative: 1 %
EOS ABS: 0.3 10*3/uL (ref 0.0–0.5)
Eosinophils Relative: 3 %
HEMATOCRIT: 43.1 % (ref 34.8–46.6)
HEMOGLOBIN: 14.4 g/dL (ref 11.6–15.9)
Lymphocytes Relative: 26 %
Lymphs Abs: 2 10*3/uL (ref 0.9–3.3)
MCH: 28 pg (ref 26.0–34.0)
MCHC: 33.4 g/dL (ref 32.0–36.0)
MCV: 83.9 fL (ref 81.0–101.0)
Monocytes Absolute: 0.6 10*3/uL (ref 0.1–0.9)
Monocytes Relative: 8 %
NEUTROS ABS: 4.7 10*3/uL (ref 1.5–6.5)
NEUTROS PCT: 62 %
Platelet Count: 311 10*3/uL (ref 145–400)
RBC: 5.14 MIL/uL (ref 3.70–5.32)
RDW: 13.3 % (ref 11.1–15.7)
WBC: 7.7 10*3/uL (ref 3.9–10.0)

## 2017-12-27 NOTE — Progress Notes (Signed)
Hematology and Oncology Follow Up Visit  Anna Barton [id number] 1956/09/21 62 y.o. 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  Is a tough day for Anna today.  On Monday, Anna daughter, husband and granddaughter are moving up to [place].  He got a job at the [employer].  This makes Anna sad.  However, there are many options for Anna to be able to travel up to [place] to see Anna.  Anna Barton is doing well.  The issue with him that he now has a motorcycle.  He lives in [place].  He has not been able to drive it because of all the rain.  Anna health is doing great.  She is exercising.  She is working out.  She is still working full-time.  She has had no problems with bowels or bladder.  She is had no cough or shortness of breath.  She has had no fever.  She is had no leg swelling.  She had no rashes.  She is a little bit lax on Anna vitamin D. She will start taking this more diligently.   She's not noted any changes with the neurofibromatosis.   Overall, Anna performance status is ECOG 0.  Medications:  Current Outpatient Medications:  .  aspirin 81 MG tablet, Take 81 mg by mouth daily., Disp: , Rfl:  .  Cholecalciferol (VITAMIN D3) 5000 units CAPS, Take by mouth., Disp: , Rfl:  .  citalopram (CELEXA) 20 MG tablet, , Disp: , Rfl: 5 .  ibuprofen (ADVIL,MOTRIN) 800 MG tablet, Take 400 mg by mouth as needed. , Disp: , Rfl:  .  UNABLE TO FIND, Magnesium oil, Disp: , Rfl:   Allergies:  Allergies  Allergen Reactions  . Penicillins Anaphylaxis and Shortness Of Breath  . Codeine Nausea And Vomiting and Nausea Only    Past Medical History, Surgical history, Social history, and Family History were reviewed and updated.  Review of Systems: ROS  Physical Exam:  weight is 161 lb (73 kg). Anna oral temperature  is 98.5 F (36.9 C). Anna blood pressure is 146/73 (abnormal) and Anna pulse is 78. Anna respiration is 16 and oxygen saturation is 99%.  Physical Exam  Constitutional: She is oriented to person, place, and time.  HENT:  Head: Normocephalic and atraumatic.  Mouth/Throat: Oropharynx is clear and moist.  Eyes: EOM are normal. Pupils are equal, round, and reactive to light.  Neck: Normal range of motion.  Cardiovascular: Normal rate, regular rhythm and normal heart sounds.  Pulmonary/Chest: Effort normal and breath sounds normal.  Abdominal: Soft. Bowel sounds are normal.  Musculoskeletal: Normal range of motion. She exhibits no edema, tenderness or deformity.  Lymphadenopathy:    She has no cervical adenopathy.  Neurological: She is alert and oriented to person, place, and time.  Skin: Skin is warm and dry. No rash noted. No erythema.  Psychiatric: She has a normal mood and affect. Anna behavior is normal. Judgment and thought content normal.  Vitals reviewed.    Lab Results  Component Value Date   WBC 7.7 12/27/2017   HGB 14.0 06/21/2017   HCT 43.1 12/27/2017   MCV 83.9 12/27/2017   PLT 311 12/27/2017     Chemistry      Component Value Date/Time   NA 138 06/21/2017 1140  K 4.0 06/21/2017 1140   CL 99 12/15/2014 1033   CO2 28 06/21/2017 1140   BUN 10.5 06/21/2017 1140   CREATININE 0.8 06/21/2017 1140      Component Value Date/Time   CALCIUM 9.5 06/21/2017 1140   ALKPHOS 88 06/21/2017 1140   AST 16 06/21/2017 1140   ALT 14 06/21/2017 1140   BILITOT 1.01 06/21/2017 1140         Impression and Plan: [name] is 62 year old white female with a history of stage IIa ductal carcinoma right breast. She underwent systemic chemotherapy in the adjuvant setting. She did receive Cytoxan/Taxotere.She completed this in December 2009.   She received 5 years of Femara. She completed this in December of 2014.  Everything is going quite well for Anna.  I cannot find any evidence of  recurrent disease.  We will plan to get Anna back in 6 more months.  Once we get Anna to the 10-year mark, then we will move Anna out to yearly follow-up.   [name], MD 2/22/201912:36 PM

## 2017-12-28 LAB — VITAMIN D 25 HYDROXY (VIT D DEFICIENCY, FRACTURES): Vit D, 25-Hydroxy: 40 ng/mL (ref 30.0–100.0)

## 2017-12-30 ENCOUNTER — Telehealth: Payer: Self-pay | Admitting: *Deleted

## 2017-12-30 NOTE — Telephone Encounter (Addendum)
Patient is aware of results.   ----- Message from [name], MD sent at 12/27/2017  3:24 PM EST ----- Call - chemistry studies all look good!!!  Lattie Haw, Rudell Cobb, MD  P Onc Nurse Hp        Call - vit d is normal!! pete

## 2018-01-15 DIAGNOSIS — Z01419 Encounter for gynecological examination (general) (routine) without abnormal findings: Secondary | ICD-10-CM | POA: Diagnosis not present

## 2018-01-15 DIAGNOSIS — Z6824 Body mass index (BMI) 24.0-24.9, adult: Secondary | ICD-10-CM | POA: Diagnosis not present

## 2018-01-15 DIAGNOSIS — Z124 Encounter for screening for malignant neoplasm of cervix: Secondary | ICD-10-CM | POA: Diagnosis not present

## 2018-01-15 DIAGNOSIS — Z1389 Encounter for screening for other disorder: Secondary | ICD-10-CM | POA: Diagnosis not present

## 2018-01-15 DIAGNOSIS — Z130 Encounter for screening for diseases of the blood and blood-forming organs and certain disorders involving the immune mechanism: Secondary | ICD-10-CM | POA: Diagnosis not present

## 2018-01-16 DIAGNOSIS — Z124 Encounter for screening for malignant neoplasm of cervix: Secondary | ICD-10-CM | POA: Diagnosis not present

## 2018-01-21 DIAGNOSIS — Z0000 Encounter for general adult medical examination without abnormal findings: Secondary | ICD-10-CM | POA: Diagnosis not present

## 2018-02-03 ENCOUNTER — Encounter: Payer: Self-pay | Admitting: Gastroenterology

## 2018-04-02 ENCOUNTER — Other Ambulatory Visit: Payer: Self-pay | Admitting: Pharmacist

## 2018-05-29 ENCOUNTER — Ambulatory Visit (AMBULATORY_SURGERY_CENTER): Payer: Self-pay

## 2018-05-29 VITALS — Ht 67.0 in | Wt 161.8 lb

## 2018-05-29 DIAGNOSIS — Z86010 Personal history of colonic polyps: Secondary | ICD-10-CM

## 2018-05-29 DIAGNOSIS — Z8371 Family history of colonic polyps: Secondary | ICD-10-CM

## 2018-05-29 MED ORDER — NA SULFATE-K SULFATE-MG SULF 17.5-3.13-1.6 GM/177ML PO SOLN: 1.0000 | Freq: Once | ORAL | 0 refills | Status: AC

## 2018-05-29 NOTE — Progress Notes (Signed)
Per pt, no allergies to soy or egg products.Pt not taking any weight loss meds or using  O2 at home.  Pt refused emmi video. 

## 2018-06-13 ENCOUNTER — Ambulatory Visit (AMBULATORY_SURGERY_CENTER): Payer: BLUE CROSS/BLUE SHIELD | Admitting: Gastroenterology

## 2018-06-13 ENCOUNTER — Encounter: Payer: Self-pay | Admitting: Gastroenterology

## 2018-06-13 VITALS — BP 135/63 | HR 70 | Temp 98.9°F | Resp 20 | Ht 67.0 in | Wt 161.0 lb

## 2018-06-13 DIAGNOSIS — Z86010 Personal history of colonic polyps: Secondary | ICD-10-CM | POA: Diagnosis not present

## 2018-06-13 DIAGNOSIS — K5289 Other specified noninfective gastroenteritis and colitis: Secondary | ICD-10-CM

## 2018-06-13 DIAGNOSIS — K529 Noninfective gastroenteritis and colitis, unspecified: Secondary | ICD-10-CM

## 2018-06-13 DIAGNOSIS — D123 Benign neoplasm of transverse colon: Secondary | ICD-10-CM | POA: Diagnosis not present

## 2018-06-13 DIAGNOSIS — K598 Other specified functional intestinal disorders: Secondary | ICD-10-CM | POA: Diagnosis not present

## 2018-06-13 DIAGNOSIS — K6389 Other specified diseases of intestine: Secondary | ICD-10-CM | POA: Diagnosis not present

## 2018-06-13 MED ORDER — SODIUM CHLORIDE 0.9 % IV SOLN: 500.0000 mL | Freq: Once | INTRAVENOUS | Status: DC

## 2018-06-13 NOTE — Op Note (Signed)
[facility] Patient Name: Anna Barton Procedure Date: 06/13/2018 8:48 AM MRN: [id number] Endoscopist: Mauri Pole , MD Age: 62 Referring MD:  Date of Birth: 24-Jun-1956 Gender: Female Account #: [id number] Procedure:                Colonoscopy Indications:              High risk colon cancer surveillance: Personal                            history of colonic polyps, High risk colon cancer                            surveillance: Personal history of adenoma less than                            10 mm in size Medicines:                Monitored Anesthesia Care Procedure:                Pre-Anesthesia Assessment:                           - Prior to the procedure, a History and Physical                            was performed, and patient medications and                            allergies were reviewed. The patient's tolerance of                            previous anesthesia was also reviewed. The risks                            and benefits of the procedure and the sedation                            options and risks were discussed with the patient.                            All questions were answered, and informed consent                            was obtained. Prior Anticoagulants: The patient has                            taken no previous anticoagulant or antiplatelet                            agents. ASA Grade Assessment: II - A patient with                            mild systemic disease. After reviewing the risks  and benefits, the patient was deemed in                            satisfactory condition to undergo the procedure.                           After obtaining informed consent, the colonoscope                            was passed under direct vision. Throughout the                            procedure, the patient's blood pressure, pulse, and                            oxygen saturations were monitored  continuously. The                            Model CF-HQ190L [id number]) scope was introduced                            through the anus and advanced to the the terminal                            ileum, with identification of the appendiceal                            orifice and IC valve. The colonoscopy was performed                            without difficulty. The patient tolerated the                            procedure well. The quality of the bowel                            preparation was excellent. The terminal ileum,                            ileocecal valve, appendiceal orifice, and rectum                            were photographed. Scope In: 8:55:14 AM Scope Out: 9:13:59 AM Scope Withdrawal Time: 0 hours 14 minutes 12 seconds  Total Procedure Duration: 0 hours 18 minutes 45 seconds  Findings:                 The perianal and digital rectal examinations were                            normal.                           Two sessile polyps were found in the transverse  colon. The polyps were 5 to 7 mm in size. These                            polyps were removed with a cold snare. Resection                            and retrieval were complete.                           Patchy mild inflammation characterized by altered                            vascularity, congestion (edema), erythema,                            friability and mucus was found in the rectum, in                            the sigmoid colon, in the descending colon, in the                            transverse colon and in the cecum. Biopsies were                            taken with a cold forceps for histology.                           Non-bleeding internal hemorrhoids were found during                            retroflexion. The hemorrhoids were small. Complications:            No immediate complications. Estimated Blood Loss:     Estimated blood loss was  minimal. Impression:               - Two 5 to 7 mm polyps in the transverse colon,                            removed with a cold snare. Resected and retrieved.                           - Patchy mild inflammation was found in the rectum,                            in the sigmoid colon, in the descending colon, in                            the transverse colon and in the cecum secondary to                            colitis. Biopsied.                           - Non-bleeding internal hemorrhoids.  Recommendation:           - Patient has a contact number available for                            emergencies. The signs and symptoms of potential                            delayed complications were discussed with the                            patient. Return to normal activities tomorrow.                            Written discharge instructions were provided to the                            patient.                           - Resume previous diet.                           - Continue present medications.                           - Await pathology results.                           - No aspirin, ibuprofen, naproxen, or other                            non-steroidal anti-inflammatory drugs.                           - Repeat colonoscopy date to be determined after                            pending pathology results are reviewed for                            surveillance based on pathology results. Mauri Pole, MD 06/13/2018 9:19:05 AM This report has been signed electronically.

## 2018-06-13 NOTE — Progress Notes (Signed)
Pt's states no medical or surgical changes since previsit or office visit. 

## 2018-06-13 NOTE — Patient Instructions (Signed)
Please read handouts on polyps and hemorrhoids. Continue present medications. No Aspirin, Naproxen, or other non-steriodal anti-inflammatory drugs.    YOU HAD AN ENDOSCOPIC PROCEDURE TODAY AT [facility]:   Refer to the procedure report that was given to you for any specific questions about what was found during the examination.  If the procedure report does not answer your questions, please call your gastroenterologist to clarify.  If you requested that your care partner not be given the details of your procedure findings, then the procedure report has been included in a sealed envelope for you to review at your convenience later.  YOU SHOULD EXPECT: Some feelings of bloating in the abdomen. Passage of more gas than usual.  Walking can help get rid of the air that was put into your GI tract during the procedure and reduce the bloating. If you had a lower endoscopy (such as a colonoscopy or flexible sigmoidoscopy) you may notice spotting of blood in your stool or on the toilet paper. If you underwent a bowel prep for your procedure, you may not have a normal bowel movement for a few days.  Please Note:  You might notice some irritation and congestion in your nose or some drainage.  This is from the oxygen used during your procedure.  There is no need for concern and it should clear up in a day or so.  SYMPTOMS TO REPORT IMMEDIATELY:   Following lower endoscopy (colonoscopy or flexible sigmoidoscopy):  Excessive amounts of blood in the stool  Significant tenderness or worsening of abdominal pains  Swelling of the abdomen that is new, acute  Fever of 100F or higher   For urgent or emergent issues, a gastroenterologist can be reached at any hour by calling [phone].   DIET:  We do recommend a small meal at first, but then you may proceed to your regular diet.  Drink plenty of fluids but you should avoid alcoholic beverages for 24 hours.  ACTIVITY:  You should plan to  take it easy for the rest of today and you should NOT DRIVE or use heavy machinery until tomorrow (because of the sedation medicines used during the test).    FOLLOW UP: Our staff will call the number listed on your records the next business day following your procedure to check on you and address any questions or concerns that you may have regarding the information given to you following your procedure. If we do not reach you, we will leave a message.  However, if you are feeling well and you are not experiencing any problems, there is no need to return our call.  We will assume that you have returned to your regular daily activities without incident.  If any biopsies were taken you will be contacted by phone or by letter within the next 1-3 weeks.  Please call us at [phone] if you have not heard about the biopsies in 3 weeks.    SIGNATURES/CONFIDENTIALITY: You and/or your care partner have signed paperwork which will be entered into your electronic medical record.  These signatures attest to the fact that that the information above on your After Visit Summary has been reviewed and is understood.  Full responsibility of the confidentiality of this discharge information lies with you and/or your care-partner.

## 2018-06-13 NOTE — Progress Notes (Signed)
Report to PACU, RN, vss, BBS= Clear.  

## 2018-06-13 NOTE — Progress Notes (Signed)
Called to room to assist during endoscopic procedure.  Patient ID and intended procedure confirmed with present staff. Received instructions for my participation in the procedure from the performing physician.  

## 2018-06-16 ENCOUNTER — Telehealth: Payer: Self-pay

## 2018-06-16 NOTE — Telephone Encounter (Signed)
  Follow up Call-  Call back number 06/13/2018  Post procedure Call Back phone  # [phone]  Permission to leave phone message Yes  Some recent data might be hidden     Patient questions:  Do you have a fever, pain , or abdominal swelling? No. Pain Score  0 *  Have you tolerated food without any problems? Yes.    Have you been able to return to your normal activities? Yes.    Do you have any questions about your discharge instructions: Diet   No. Medications  No. Follow up visit  No.  Do you have questions or concerns about your Care? No.  Actions: * If pain score is 4 or above: No action needed, pain <4.

## 2018-06-19 ENCOUNTER — Encounter: Payer: Self-pay | Admitting: Gastroenterology

## 2018-06-26 ENCOUNTER — Inpatient Hospital Stay: Payer: BLUE CROSS/BLUE SHIELD | Attending: Hematology & Oncology | Admitting: Hematology & Oncology

## 2018-06-26 ENCOUNTER — Inpatient Hospital Stay: Payer: BLUE CROSS/BLUE SHIELD

## 2018-06-26 ENCOUNTER — Other Ambulatory Visit: Payer: Self-pay

## 2018-06-26 VITALS — BP 142/68 | HR 75 | Temp 98.7°F | Resp 18 | Wt 161.0 lb

## 2018-06-26 DIAGNOSIS — Z170 Estrogen receptor positive status [ER+]: Secondary | ICD-10-CM | POA: Insufficient documentation

## 2018-06-26 DIAGNOSIS — Q8500 Neurofibromatosis, unspecified: Secondary | ICD-10-CM | POA: Diagnosis not present

## 2018-06-26 DIAGNOSIS — Z853 Personal history of malignant neoplasm of breast: Secondary | ICD-10-CM | POA: Diagnosis not present

## 2018-06-26 DIAGNOSIS — Z9221 Personal history of antineoplastic chemotherapy: Secondary | ICD-10-CM | POA: Insufficient documentation

## 2018-06-26 DIAGNOSIS — C50611 Malignant neoplasm of axillary tail of right female breast: Secondary | ICD-10-CM

## 2018-06-26 DIAGNOSIS — Z79899 Other long term (current) drug therapy: Secondary | ICD-10-CM | POA: Diagnosis not present

## 2018-06-26 DIAGNOSIS — Z9223 Personal history of estrogen therapy: Secondary | ICD-10-CM | POA: Diagnosis not present

## 2018-06-26 DIAGNOSIS — T386X5A Adverse effect of antigonadotrophins, antiestrogens, antiandrogens, not elsewhere classified, initial encounter: Principal | ICD-10-CM

## 2018-06-26 DIAGNOSIS — M818 Other osteoporosis without current pathological fracture: Secondary | ICD-10-CM

## 2018-06-26 LAB — CBC WITH DIFFERENTIAL (CANCER CENTER ONLY)
BASOS ABS: 0.1 10*3/uL (ref 0.0–0.1)
BASOS PCT: 1 %
EOS ABS: 0.4 10*3/uL (ref 0.0–0.5)
Eosinophils Relative: 4 %
HEMATOCRIT: 41.1 % (ref 34.8–46.6)
HEMOGLOBIN: 13.5 g/dL (ref 11.6–15.9)
Lymphocytes Relative: 22 %
Lymphs Abs: 1.8 10*3/uL (ref 0.9–3.3)
MCH: 28 pg (ref 26.0–34.0)
MCHC: 32.8 g/dL (ref 32.0–36.0)
MCV: 85.1 fL (ref 81.0–101.0)
MONOS PCT: 8 %
Monocytes Absolute: 0.7 10*3/uL (ref 0.1–0.9)
NEUTROS ABS: 5.3 10*3/uL (ref 1.5–6.5)
Neutrophils Relative %: 65 %
Platelet Count: 303 10*3/uL (ref 145–400)
RBC: 4.83 MIL/uL (ref 3.70–5.32)
RDW: 13.1 % (ref 11.1–15.7)
WBC: 8.2 10*3/uL (ref 3.9–10.0)

## 2018-06-26 LAB — CMP (CANCER CENTER ONLY)
ALK PHOS: 90 U/L — AB (ref 26–84)
ALT: 17 U/L (ref 10–47)
ANION GAP: 4 — AB (ref 5–15)
AST: 17 U/L (ref 11–38)
Albumin: 3.5 g/dL (ref 3.5–5.0)
BUN: 10 mg/dL (ref 7–22)
CALCIUM: 9.1 mg/dL (ref 8.0–10.3)
CHLORIDE: 106 mmol/L (ref 98–108)
CO2: 31 mmol/L (ref 18–33)
Creatinine: 0.6 mg/dL (ref 0.60–1.20)
Glucose, Bld: 98 mg/dL (ref 73–118)
Potassium: 4.3 mmol/L (ref 3.3–4.7)
Sodium: 141 mmol/L (ref 128–145)
Total Bilirubin: 0.8 mg/dL (ref 0.2–1.6)
Total Protein: 6.7 g/dL (ref 6.4–8.1)

## 2018-06-26 NOTE — Progress Notes (Signed)
Hematology and Oncology Follow Up Visit  ADWOA AXE [id number] [birth year] 62 y.o. 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 is back for followup.  She is quite happy that her daughter and husband and granddaughter are now moving back to [place].  They were only up in [place] for a year.  They really did not like it up there.  Her husband had quadruple bypass surgery in March.  Thankfully, he is doing quite well.  Her health is doing superb.  She is exercising.  She is had no problems with cough or shortness of breath.  She is had no weakness.  She is had no bony pain.  She is doing well on taking vitamin D.  There is been no change in bowel or bladder habits.  She had a colonoscopy 2 weeks ago.  She had a polyp that was removed that was benign.  She goes back in 5 years for another one.  She has neurofibromatosis.  Thankfully this is not cause any problems for her.    Overall, her performance status is ECOG 0.  Medications:  Current Outpatient Medications:  .  citalopram (CELEXA) 20 MG tablet, daily. , Disp: , Rfl: 5 .  ibuprofen (ADVIL,MOTRIN) 800 MG tablet, Take 400 mg by mouth as needed. , Disp: , Rfl:   Allergies:  Allergies  Allergen Reactions  . Peanut-Containing Drug Products     Mouth burning, and tongue swells  . Penicillins Anaphylaxis and Shortness Of Breath  . Codeine Nausea And Vomiting and Nausea Only    Past Medical History, Surgical history, Social history, and Family History were reviewed and updated.  Review of Systems: Review of Systems  Constitutional: Negative.   HENT: Negative.   Eyes: Negative.   Respiratory: Negative.   Cardiovascular: Negative.   Gastrointestinal: Negative.   Genitourinary: Negative.   Musculoskeletal: Negative.   Skin: Negative.   Neurological: Negative.    Endo/Heme/Allergies: Negative.   Psychiatric/Behavioral: Negative.     Physical Exam:  weight is 161 lb (73 kg). Her oral temperature is 98.7 F (37.1 C). Her blood pressure is 142/68 (abnormal) and her pulse is 75. Her respiration is 18 and oxygen saturation is 99%.  Physical Exam  Constitutional: She is oriented to person, place, and time.  HENT:  Head: Normocephalic and atraumatic.  Mouth/Throat: Oropharynx is clear and moist.  Eyes: Pupils are equal, round, and reactive to light. EOM are normal.  Neck: Normal range of motion.  Cardiovascular: Normal rate, regular rhythm and normal heart sounds.  Pulmonary/Chest: Effort normal and breath sounds normal.  Abdominal: Soft. Bowel sounds are normal.  Musculoskeletal: Normal range of motion. She exhibits no edema, tenderness or deformity.  Lymphadenopathy:    She has no cervical adenopathy.  Neurological: She is alert and oriented to person, place, and time.  Skin: Skin is warm and dry. No rash noted. No erythema.  Psychiatric: She has a normal mood and affect. Her behavior is normal. Judgment and thought content normal.  Vitals reviewed.    Lab Results  Component Value Date   WBC 8.2 06/26/2018   HGB 13.5 06/26/2018   HCT 41.1 06/26/2018   MCV 85.1 06/26/2018   PLT 303 06/26/2018     Chemistry      Component Value Date/Time   NA 141 06/26/2018 1156  NA 138 06/21/2017 1140   K 4.3 06/26/2018 1156   K 4.0 06/21/2017 1140   CL 106 06/26/2018 1156   CL 99 12/15/2014 1033   CO2 31 06/26/2018 1156   CO2 28 06/21/2017 1140   BUN 10 06/26/2018 1156   BUN 10.5 06/21/2017 1140   CREATININE 0.60 06/26/2018 1156   CREATININE 0.8 06/21/2017 1140      Component Value Date/Time   CALCIUM 9.1 06/26/2018 1156   CALCIUM 9.5 06/21/2017 1140   ALKPHOS 90 (H) 06/26/2018 1156   ALKPHOS 88 06/21/2017 1140   AST 17 06/26/2018 1156   AST 16 06/21/2017 1140   ALT 17 06/26/2018 1156   ALT 14 06/21/2017 1140   BILITOT 0.8 06/26/2018  1156   BILITOT 1.01 06/21/2017 1140         Impression and Plan: [name] is 62 year old white female with a history of stage IIa ductal carcinoma right breast. She underwent systemic chemotherapy in the adjuvant setting. She did receive Cytoxan/Taxotere.She completed this in December 2009.   She received 5 years of Femara. She completed this in December of 2014.  Everything is going quite well for her.  I cannot find any evidence of recurrent disease.  We will plan to get her back in 6 more months.  Once we get her to the 10-year mark, then we will move her out to yearly follow-up.   [name], MD 8/22/20191:01 PM

## 2018-06-27 LAB — VITAMIN D 25 HYDROXY (VIT D DEFICIENCY, FRACTURES): Vit D, 25-Hydroxy: 42 ng/mL (ref 30.0–100.0)

## 2018-07-16 DIAGNOSIS — F419 Anxiety disorder, unspecified: Secondary | ICD-10-CM | POA: Diagnosis not present

## 2018-10-16 DIAGNOSIS — L814 Other melanin hyperpigmentation: Secondary | ICD-10-CM | POA: Diagnosis not present

## 2018-10-16 DIAGNOSIS — L821 Other seborrheic keratosis: Secondary | ICD-10-CM | POA: Diagnosis not present

## 2018-10-16 DIAGNOSIS — D225 Melanocytic nevi of trunk: Secondary | ICD-10-CM | POA: Diagnosis not present

## 2018-10-16 DIAGNOSIS — Z86018 Personal history of other benign neoplasm: Secondary | ICD-10-CM | POA: Diagnosis not present

## 2018-12-26 ENCOUNTER — Other Ambulatory Visit: Payer: BLUE CROSS/BLUE SHIELD

## 2018-12-26 ENCOUNTER — Inpatient Hospital Stay (HOSPITAL_BASED_OUTPATIENT_CLINIC_OR_DEPARTMENT_OTHER): Payer: BLUE CROSS/BLUE SHIELD | Admitting: Family

## 2018-12-26 ENCOUNTER — Inpatient Hospital Stay: Payer: BLUE CROSS/BLUE SHIELD | Attending: Hematology & Oncology

## 2018-12-26 ENCOUNTER — Ambulatory Visit: Payer: BLUE CROSS/BLUE SHIELD | Admitting: Family

## 2018-12-26 ENCOUNTER — Telehealth: Payer: Self-pay | Admitting: Family

## 2018-12-26 VITALS — BP 134/55 | HR 64 | Temp 98.8°F | Ht 67.0 in | Wt 160.5 lb

## 2018-12-26 DIAGNOSIS — Z79899 Other long term (current) drug therapy: Secondary | ICD-10-CM | POA: Diagnosis not present

## 2018-12-26 DIAGNOSIS — Z9223 Personal history of estrogen therapy: Secondary | ICD-10-CM | POA: Insufficient documentation

## 2018-12-26 DIAGNOSIS — Z9221 Personal history of antineoplastic chemotherapy: Secondary | ICD-10-CM | POA: Insufficient documentation

## 2018-12-26 DIAGNOSIS — R202 Paresthesia of skin: Secondary | ICD-10-CM | POA: Insufficient documentation

## 2018-12-26 DIAGNOSIS — Z170 Estrogen receptor positive status [ER+]: Secondary | ICD-10-CM | POA: Insufficient documentation

## 2018-12-26 DIAGNOSIS — R200 Anesthesia of skin: Secondary | ICD-10-CM | POA: Insufficient documentation

## 2018-12-26 DIAGNOSIS — Z9013 Acquired absence of bilateral breasts and nipples: Secondary | ICD-10-CM

## 2018-12-26 DIAGNOSIS — Q8500 Neurofibromatosis, unspecified: Secondary | ICD-10-CM | POA: Diagnosis not present

## 2018-12-26 DIAGNOSIS — Z853 Personal history of malignant neoplasm of breast: Secondary | ICD-10-CM

## 2018-12-26 DIAGNOSIS — C50611 Malignant neoplasm of axillary tail of right female breast: Secondary | ICD-10-CM

## 2018-12-26 LAB — CMP (CANCER CENTER ONLY)
ALBUMIN: 4.1 g/dL (ref 3.5–5.0)
ALK PHOS: 81 U/L (ref 38–126)
ALT: 14 U/L (ref 0–44)
AST: 12 U/L — ABNORMAL LOW (ref 15–41)
Anion gap: 5 (ref 5–15)
BUN: 12 mg/dL (ref 8–23)
CALCIUM: 8.9 mg/dL (ref 8.9–10.3)
CO2: 30 mmol/L (ref 22–32)
CREATININE: 0.73 mg/dL (ref 0.44–1.00)
Chloride: 104 mmol/L (ref 98–111)
GFR, Est AFR Am: >60 mL/min (ref >60)
GFR, Estimated: >60 mL/min (ref >60)
GLUCOSE: 111 mg/dL — AB (ref 70–99)
Potassium: 4 mmol/L (ref 3.5–5.1)
Sodium: 139 mmol/L (ref 135–145)
Total Bilirubin: 0.7 mg/dL (ref 0.3–1.2)
Total Protein: 6.3 g/dL — ABNORMAL LOW (ref 6.5–8.1)

## 2018-12-26 LAB — CBC WITH DIFFERENTIAL (CANCER CENTER ONLY)
ABS IMMATURE GRANULOCYTES: 0.03 10*3/uL (ref 0.00–0.07)
BASOS ABS: 0.1 10*3/uL (ref 0.0–0.1)
Basophils Relative: 2 %
EOS PCT: 3 %
Eosinophils Absolute: 0.2 10*3/uL (ref 0.0–0.5)
HCT: 41.8 % (ref 36.0–46.0)
Hemoglobin: 13.5 g/dL (ref 12.0–15.0)
IMMATURE GRANULOCYTES: 0 %
LYMPHS ABS: 1.8 10*3/uL (ref 0.7–4.0)
Lymphocytes Relative: 25 %
MCH: 27.8 pg (ref 26.0–34.0)
MCHC: 32.3 g/dL (ref 30.0–36.0)
MCV: 86.2 fL (ref 80.0–100.0)
MONOS PCT: 9 %
Monocytes Absolute: 0.7 10*3/uL (ref 0.1–1.0)
NRBC: 0 % (ref 0.0–0.2)
Neutro Abs: 4.4 10*3/uL (ref 1.7–7.7)
Neutrophils Relative %: 61 %
Platelet Count: 313 10*3/uL (ref 150–400)
RBC: 4.85 MIL/uL (ref 3.87–5.11)
RDW: 13 % (ref 11.5–15.5)
WBC Count: 7.2 10*3/uL (ref 4.0–10.5)

## 2018-12-26 NOTE — Progress Notes (Signed)
Hematology and Oncology Follow Up Visit  Anna Barton [id number] [birth year] 63 y.o. 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 She is doing quite well and has no complaints at this time.  Breast exam today was negative. Bilateral mastectomy with reconstruction intact.  No lymphadenopathy noted.  No fever, chills, n/v, cough, rash, dizziness, SOB, chest pain, palpitations, abdominal pain or changes in bowel or bladder habits.  No swelling or tenderness in her extremities. She will sometimes have numbness and tingling in her hands and feet when it is cold.  No episodes of bleeding, no bruising or petechiae.  She has maintained a good appetite and is staying well hydrated Her weight is stable.   ECOG Performance Status: 0 - Asymptomatic  Medications:  Allergies as of 12/26/2018      Reactions   Peanut-containing Drug Products    Mouth burning, and tongue swells   Penicillins Anaphylaxis, Shortness Of Breath   Codeine Nausea And Vomiting, Nausea Only      Medication List       Accurate as of December 26, 2018 11:36 AM. Always use your most recent med list.        citalopram 20 MG tablet Commonly known as:  CELEXA daily.   ibuprofen 800 MG tablet Commonly known as:  ADVIL,MOTRIN Take 400 mg by mouth as needed.       Allergies:  Allergies  Allergen Reactions  . Peanut-Containing Drug Products     Mouth burning, and tongue swells  . Penicillins Anaphylaxis and Shortness Of Breath  . Codeine Nausea And Vomiting and Nausea Only    Past Medical History, Surgical history, Social history, and Family History were reviewed and updated.  Review of Systems: All other 10 point review of systems is negative.   Physical Exam:  height is 5' 7" (1.702 m) and weight is 160 lb 8 oz (72.8 kg). Her oral  temperature is 98.8 F (37.1 C). Her blood pressure is 134/55 (abnormal) and her pulse is 64. Her oxygen saturation is 100%.   Wt Readings from Last 3 Encounters:  12/26/18 160 lb 8 oz (72.8 kg)  06/26/18 161 lb (73 kg)  06/13/18 161 lb (73 kg)    Ocular: Sclerae unicteric, pupils equal, round and reactive to light Ear-nose-throat: Oropharynx clear, dentition fair Lymphatic: No cervical, supraclavicular or axillary adenopathy Lungs no rales or rhonchi, good excursion bilaterally Heart regular rate and rhythm, no murmur appreciated Abd soft, nontender, positive bowel sounds, no liver or spleen tip palpated on exam, no fluid wave  MSK no focal spinal tenderness, no joint edema Neuro: non-focal, well-oriented, appropriate affect Breasts: No changes, no mass, lesion or rash noted.   Lab Results  Component Value Date   WBC 7.2 12/26/2018   HGB 13.5 12/26/2018   HCT 41.8 12/26/2018   MCV 86.2 12/26/2018   PLT 313 12/26/2018   No results found for: FERRITIN, IRON, TIBC, UIBC, IRONPCTSAT Lab Results  Component Value Date   RBC 4.85 12/26/2018   No results found for: KPAFRELGTCHN, LAMBDASER, KAPLAMBRATIO No results found for: IGGSERUM, IGA, IGMSERUM No results found for: Ronnald Ramp, A1GS, A2GS, Tillman Sers, SPEI   Chemistry      Component Value Date/Time   NA 141 06/26/2018 1156   NA 138 06/21/2017 1140   K 4.3 06/26/2018 1156  K 4.0 06/21/2017 1140   CL 106 06/26/2018 1156   CL 99 12/15/2014 1033   CO2 31 06/26/2018 1156   CO2 28 06/21/2017 1140   BUN 10 06/26/2018 1156   BUN 10.5 06/21/2017 1140   CREATININE 0.60 06/26/2018 1156   CREATININE 0.8 06/21/2017 1140      Component Value Date/Time   CALCIUM 9.1 06/26/2018 1156   CALCIUM 9.5 06/21/2017 1140   ALKPHOS 90 (H) 06/26/2018 1156   ALKPHOS 88 06/21/2017 1140   AST 17 06/26/2018 1156   AST 16 06/21/2017 1140   ALT 17 06/26/2018 1156   ALT 14 06/21/2017 1140   BILITOT 0.8  06/26/2018 1156   BILITOT 1.01 06/21/2017 1140       Impression and Plan: Anna Barton is a very pleasant 63 yo caucasian female with a history of stage IIa ductal carcinoma right breast. She underwent systemic chemotherapy in the adjuvant setting. She did receive Cytoxan/Taxotere.She completed this in December 2009.  She finished 5 years of Femara in December 2014. She has done well and so far there has been no evidence of recurrent disease.  She is now 10 years out and ready to go to annual appointments.  We will plan to see her back in one year for MD follow-up.  She will contact our office with any questions or concerns. We can certainly see her sooner if need be.   Anna Peace, NP 2/21/202011:36 AM

## 2018-12-26 NOTE — Telephone Encounter (Signed)
Appointments scheduled avs/calendar printed  per 2/21 los °

## 2019-03-20 DIAGNOSIS — Z6825 Body mass index (BMI) 25.0-25.9, adult: Secondary | ICD-10-CM | POA: Diagnosis not present

## 2019-03-20 DIAGNOSIS — Z0000 Encounter for general adult medical examination without abnormal findings: Secondary | ICD-10-CM | POA: Diagnosis not present

## 2019-03-20 DIAGNOSIS — Z01419 Encounter for gynecological examination (general) (routine) without abnormal findings: Secondary | ICD-10-CM | POA: Diagnosis not present

## 2019-03-20 DIAGNOSIS — Z1389 Encounter for screening for other disorder: Secondary | ICD-10-CM | POA: Diagnosis not present

## 2019-03-24 DIAGNOSIS — Z0000 Encounter for general adult medical examination without abnormal findings: Secondary | ICD-10-CM | POA: Diagnosis not present

## 2019-07-17 DIAGNOSIS — F419 Anxiety disorder, unspecified: Secondary | ICD-10-CM | POA: Diagnosis not present

## 2019-07-17 DIAGNOSIS — Z853 Personal history of malignant neoplasm of breast: Secondary | ICD-10-CM | POA: Diagnosis not present

## 2019-10-20 DIAGNOSIS — D225 Melanocytic nevi of trunk: Secondary | ICD-10-CM | POA: Diagnosis not present

## 2019-10-20 DIAGNOSIS — L814 Other melanin hyperpigmentation: Secondary | ICD-10-CM | POA: Diagnosis not present

## 2019-10-20 DIAGNOSIS — Z86018 Personal history of other benign neoplasm: Secondary | ICD-10-CM | POA: Diagnosis not present

## 2019-10-20 DIAGNOSIS — L821 Other seborrheic keratosis: Secondary | ICD-10-CM | POA: Diagnosis not present

## 2019-12-28 ENCOUNTER — Other Ambulatory Visit: Payer: Self-pay

## 2019-12-28 ENCOUNTER — Inpatient Hospital Stay: Payer: BC Managed Care – PPO | Attending: Hematology & Oncology

## 2019-12-28 ENCOUNTER — Encounter: Payer: Self-pay | Admitting: Hematology & Oncology

## 2019-12-28 ENCOUNTER — Other Ambulatory Visit: Payer: Self-pay | Admitting: *Deleted

## 2019-12-28 ENCOUNTER — Inpatient Hospital Stay (HOSPITAL_BASED_OUTPATIENT_CLINIC_OR_DEPARTMENT_OTHER): Payer: BC Managed Care – PPO | Admitting: Hematology & Oncology

## 2019-12-28 VITALS — BP 143/72 | HR 77 | Temp 98.6°F | Wt 158.8 lb

## 2019-12-28 DIAGNOSIS — C50611 Malignant neoplasm of axillary tail of right female breast: Secondary | ICD-10-CM

## 2019-12-28 DIAGNOSIS — M8000XA Age-related osteoporosis with current pathological fracture, unspecified site, initial encounter for fracture: Secondary | ICD-10-CM | POA: Diagnosis not present

## 2019-12-28 DIAGNOSIS — Z9221 Personal history of antineoplastic chemotherapy: Secondary | ICD-10-CM | POA: Insufficient documentation

## 2019-12-28 DIAGNOSIS — Z170 Estrogen receptor positive status [ER+]: Secondary | ICD-10-CM

## 2019-12-28 DIAGNOSIS — Q8500 Neurofibromatosis, unspecified: Secondary | ICD-10-CM | POA: Diagnosis not present

## 2019-12-28 DIAGNOSIS — Z791 Long term (current) use of non-steroidal anti-inflammatories (NSAID): Secondary | ICD-10-CM | POA: Insufficient documentation

## 2019-12-28 DIAGNOSIS — Z9223 Personal history of estrogen therapy: Secondary | ICD-10-CM | POA: Insufficient documentation

## 2019-12-28 DIAGNOSIS — Z79899 Other long term (current) drug therapy: Secondary | ICD-10-CM | POA: Diagnosis not present

## 2019-12-28 DIAGNOSIS — C50011 Malignant neoplasm of nipple and areola, right female breast: Secondary | ICD-10-CM

## 2019-12-28 DIAGNOSIS — Z853 Personal history of malignant neoplasm of breast: Secondary | ICD-10-CM | POA: Diagnosis not present

## 2019-12-28 LAB — CBC WITH DIFFERENTIAL (CANCER CENTER ONLY)
Abs Immature Granulocytes: 0.05 10*3/uL (ref 0.00–0.07)
Basophils Absolute: 0.1 10*3/uL (ref 0.0–0.1)
Basophils Relative: 1 %
Eosinophils Absolute: 0.3 10*3/uL (ref 0.0–0.5)
Eosinophils Relative: 3 %
HCT: 41.1 % (ref 36.0–46.0)
Hemoglobin: 13.4 g/dL (ref 12.0–15.0)
Immature Granulocytes: 1 %
Lymphocytes Relative: 20 %
Lymphs Abs: 1.7 10*3/uL (ref 0.7–4.0)
MCH: 28.1 pg (ref 26.0–34.0)
MCHC: 32.6 g/dL (ref 30.0–36.0)
MCV: 86.2 fL (ref 80.0–100.0)
Monocytes Absolute: 0.6 10*3/uL (ref 0.1–1.0)
Monocytes Relative: 7 %
Neutro Abs: 6 10*3/uL (ref 1.7–7.7)
Neutrophils Relative %: 68 %
Platelet Count: 319 10*3/uL (ref 150–400)
RBC: 4.77 MIL/uL (ref 3.87–5.11)
RDW: 12.8 % (ref 11.5–15.5)
WBC Count: 8.8 10*3/uL (ref 4.0–10.5)
nRBC: 0 % (ref 0.0–0.2)

## 2019-12-28 LAB — CMP (CANCER CENTER ONLY)
ALT: 10 U/L (ref 0–44)
AST: 10 U/L — ABNORMAL LOW (ref 15–41)
Albumin: 4.3 g/dL (ref 3.5–5.0)
Alkaline Phosphatase: 88 U/L (ref 38–126)
Anion gap: 5 (ref 5–15)
BUN: 12 mg/dL (ref 8–23)
CO2: 32 mmol/L (ref 22–32)
Calcium: 9.7 mg/dL (ref 8.9–10.3)
Chloride: 102 mmol/L (ref 98–111)
Creatinine: 0.69 mg/dL (ref 0.44–1.00)
GFR, Est AFR Am: >60 mL/min (ref >60)
GFR, Estimated: >60 mL/min (ref >60)
Glucose, Bld: 116 mg/dL — ABNORMAL HIGH (ref 70–99)
Potassium: 4.3 mmol/L (ref 3.5–5.1)
Sodium: 139 mmol/L (ref 135–145)
Total Bilirubin: 0.6 mg/dL (ref 0.3–1.2)
Total Protein: 7.1 g/dL (ref 6.5–8.1)

## 2019-12-28 NOTE — Progress Notes (Signed)
Hematology and Oncology Follow Up Visit  Anna Barton [id number] May 17, 1956 64 y.o. 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 is back for followup.  She is doing quite well.  Everything seems to be going well for her.  Her husband, had quadruple bypass surgery last year.  He seems to be recovering.  She now has 2 granddaughters.  50 is 65 years old and one was just born recently.  This is certainly quite exciting.  She is still working.  She works for a [employer], [employer].  They were not too affected by the coronavirus pandemic.  She has had no problems with nausea or vomiting.  She has had no cardiac issues.  She has had no change in bowel or bladder habits.  She gets a colonoscopy every 5 years.  She has a neuro fibromatosis.  This is being followed closely by I think neurology.  Overall, her performance status is ECOG 0.  Medications:  Current Outpatient Medications:  .  Ascorbic Acid (VITAMIN C) 1000 MG tablet, Take 1,000 mg by mouth daily., Disp: , Rfl:  .  Multiple Vitamins-Minerals (WOMENS BONE HEALTH PO), Take 1 tablet by mouth every morning., Disp: , Rfl:  .  zinc gluconate 50 MG tablet, Take 50 mg by mouth daily., Disp: , Rfl:  .  Cholecalciferol 125 MCG (5000 UT) TABS, Take 3,000 mg by mouth every morning., Disp: , Rfl:  .  citalopram (CELEXA) 20 MG tablet, daily. , Disp: , Rfl: 5 .  ibuprofen (ADVIL,MOTRIN) 800 MG tablet, Take 400 mg by mouth as needed. , Disp: , Rfl:   Allergies:  Allergies  Allergen Reactions  . Peanut-Containing Drug Products     Mouth burning, and tongue swells  . Penicillins Anaphylaxis and Shortness Of Breath  . Codeine Nausea And Vomiting and Nausea Only    Past Medical History, Surgical history, Social history, and Family History were reviewed and updated.  Review of Systems: Review of  Systems  Constitutional: Negative.   HENT: Negative.   Eyes: Negative.   Respiratory: Negative.   Cardiovascular: Negative.   Gastrointestinal: Negative.   Genitourinary: Negative.   Musculoskeletal: Negative.   Skin: Negative.   Neurological: Negative.   Endo/Heme/Allergies: Negative.   Psychiatric/Behavioral: Negative.     Physical Exam:  weight is 158 lb 12.8 oz (72 kg). Her oral temperature is 98.6 F (37 C). Her blood pressure is 143/72 (abnormal) and her pulse is 77. Her oxygen saturation is 99%.  Physical Exam Vitals reviewed.  HENT:     Head: Normocephalic and atraumatic.  Eyes:     Pupils: Pupils are equal, round, and reactive to light.  Cardiovascular:     Rate and Rhythm: Normal rate and regular rhythm.     Heart sounds: Normal heart sounds.  Pulmonary:     Effort: Pulmonary effort is normal.     Breath sounds: Normal breath sounds.  Abdominal:     General: Bowel sounds are normal.     Palpations: Abdomen is soft.  Musculoskeletal:        General: No tenderness or deformity. Normal range of motion.     Cervical back: Normal range of motion.  Lymphadenopathy:     Cervical: No cervical adenopathy.  Skin:    General: Skin is warm and dry.     Findings: No  erythema or rash.  Neurological:     Mental Status: She is alert and oriented to person, place, and time.  Psychiatric:        Behavior: Behavior normal.        Thought Content: Thought content normal.        Judgment: Judgment normal.      Lab Results  Component Value Date   WBC 8.8 12/28/2019   HGB 13.4 12/28/2019   HCT 41.1 12/28/2019   MCV 86.2 12/28/2019   PLT 319 12/28/2019     Chemistry      Component Value Date/Time   NA 139 12/26/2018 1122   NA 138 06/21/2017 1140   K 4.0 12/26/2018 1122   K 4.0 06/21/2017 1140   CL 104 12/26/2018 1122   CL 99 12/15/2014 1033   CO2 30 12/26/2018 1122   CO2 28 06/21/2017 1140   BUN 12 12/26/2018 1122   BUN 10.5 06/21/2017 1140   CREATININE 0.73  12/26/2018 1122   CREATININE 0.8 06/21/2017 1140      Component Value Date/Time   CALCIUM 8.9 12/26/2018 1122   CALCIUM 9.5 06/21/2017 1140   ALKPHOS 81 12/26/2018 1122   ALKPHOS 88 06/21/2017 1140   AST 12 (L) 12/26/2018 1122   AST 16 06/21/2017 1140   ALT 14 12/26/2018 1122   ALT 14 06/21/2017 1140   BILITOT 0.7 12/26/2018 1122   BILITOT 1.01 06/21/2017 1140         Impression and Plan: Anna Barton is 64 year old white female with a history of stage IIa ductal carcinoma right breast. She underwent systemic chemotherapy in the adjuvant setting. She did receive Cytoxan/Taxotere.She completed this in December 2009.   She received 5 years of Femara. She completed this in December of 2014.  Everything is going quite well for her.  I cannot find any evidence of recurrent disease.  We will plan to get her back in 1 year.    [name], MD 2/22/202112:28 PM

## 2020-01-14 ENCOUNTER — Ambulatory Visit: Payer: BC Managed Care – PPO | Attending: Family

## 2020-01-14 DIAGNOSIS — Z23 Encounter for immunization: Secondary | ICD-10-CM

## 2020-01-14 NOTE — Progress Notes (Signed)
   Covid-19 Vaccination Clinic  Name:  Anna Barton    MRN: [id number] DOB: October 24, 1956  01/14/2020  [name] was observed post Covid-19 immunization for 30 minutes based on pre-vaccination screening without incident. She was provided with Vaccine Information Sheet and instruction to access the V-Safe system.   [name] was instructed to call 911 with any severe reactions post vaccine: Marland Kitchen Difficulty breathing  . Swelling of face and throat  . A fast heartbeat  . A bad rash all over body  . Dizziness and weakness   Immunizations Administered    Name Date Dose VIS Date Route   Moderna COVID-19 Vaccine 01/14/2020  3:52 PM 0.5 mL 10/06/2019 Intramuscular   Manufacturer: Moderna   Lot: [id number]   [place]:[id number]

## 2020-02-16 ENCOUNTER — Ambulatory Visit: Payer: BC Managed Care – PPO | Attending: Family

## 2020-02-16 DIAGNOSIS — Z23 Encounter for immunization: Secondary | ICD-10-CM

## 2020-02-16 NOTE — Progress Notes (Signed)
   Covid-19 Vaccination Clinic  Name:  Anna Barton    MRN: [id number] DOB: February 06, 1956  02/16/2020  [name] was observed post Covid-19 immunization for 15 minutes without incident. She was provided with Vaccine Information Sheet and instruction to access the V-Safe system.   [name] was instructed to call 911 with any severe reactions post vaccine: Marland Kitchen Difficulty breathing  . Swelling of face and throat  . A fast heartbeat  . A bad rash all over body  . Dizziness and weakness   Immunizations Administered    Name Date Dose VIS Date Route   Moderna COVID-19 Vaccine 02/16/2020  1:15 PM 0.5 mL 10/06/2019 Intramuscular   Manufacturer: Moderna   Lot: [id number]   [place]:[id number]

## 2020-03-21 DIAGNOSIS — Z01419 Encounter for gynecological examination (general) (routine) without abnormal findings: Secondary | ICD-10-CM | POA: Diagnosis not present

## 2020-03-21 DIAGNOSIS — Z130 Encounter for screening for diseases of the blood and blood-forming organs and certain disorders involving the immune mechanism: Secondary | ICD-10-CM | POA: Diagnosis not present

## 2020-03-21 DIAGNOSIS — Z6824 Body mass index (BMI) 24.0-24.9, adult: Secondary | ICD-10-CM | POA: Diagnosis not present

## 2020-03-21 DIAGNOSIS — Z1151 Encounter for screening for human papillomavirus (HPV): Secondary | ICD-10-CM | POA: Diagnosis not present

## 2020-03-21 DIAGNOSIS — Z1389 Encounter for screening for other disorder: Secondary | ICD-10-CM | POA: Diagnosis not present

## 2020-03-29 DIAGNOSIS — Z0000 Encounter for general adult medical examination without abnormal findings: Secondary | ICD-10-CM | POA: Diagnosis not present

## 2020-05-10 DIAGNOSIS — S92353A Displaced fracture of fifth metatarsal bone, unspecified foot, initial encounter for closed fracture: Secondary | ICD-10-CM | POA: Diagnosis not present

## 2020-05-10 DIAGNOSIS — M79672 Pain in left foot: Secondary | ICD-10-CM | POA: Diagnosis not present

## 2020-05-10 DIAGNOSIS — R600 Localized edema: Secondary | ICD-10-CM | POA: Diagnosis not present

## 2020-05-24 DIAGNOSIS — S92353A Displaced fracture of fifth metatarsal bone, unspecified foot, initial encounter for closed fracture: Secondary | ICD-10-CM | POA: Diagnosis not present

## 2020-05-24 DIAGNOSIS — R600 Localized edema: Secondary | ICD-10-CM | POA: Diagnosis not present

## 2020-05-24 DIAGNOSIS — M79672 Pain in left foot: Secondary | ICD-10-CM | POA: Diagnosis not present

## 2020-06-07 DIAGNOSIS — S92352A Displaced fracture of fifth metatarsal bone, left foot, initial encounter for closed fracture: Secondary | ICD-10-CM | POA: Diagnosis not present

## 2020-06-07 DIAGNOSIS — R600 Localized edema: Secondary | ICD-10-CM | POA: Diagnosis not present

## 2020-06-07 DIAGNOSIS — M79672 Pain in left foot: Secondary | ICD-10-CM | POA: Diagnosis not present

## 2020-06-21 DIAGNOSIS — M79672 Pain in left foot: Secondary | ICD-10-CM | POA: Diagnosis not present

## 2020-06-21 DIAGNOSIS — S92353A Displaced fracture of fifth metatarsal bone, unspecified foot, initial encounter for closed fracture: Secondary | ICD-10-CM | POA: Diagnosis not present

## 2020-06-21 DIAGNOSIS — R600 Localized edema: Secondary | ICD-10-CM | POA: Diagnosis not present

## 2020-07-18 DIAGNOSIS — S92353A Displaced fracture of fifth metatarsal bone, unspecified foot, initial encounter for closed fracture: Secondary | ICD-10-CM | POA: Diagnosis not present

## 2020-07-18 DIAGNOSIS — R600 Localized edema: Secondary | ICD-10-CM | POA: Diagnosis not present

## 2020-07-18 DIAGNOSIS — M79672 Pain in left foot: Secondary | ICD-10-CM | POA: Diagnosis not present

## 2020-08-08 DIAGNOSIS — S92352A Displaced fracture of fifth metatarsal bone, left foot, initial encounter for closed fracture: Secondary | ICD-10-CM | POA: Diagnosis not present

## 2020-08-08 DIAGNOSIS — M79672 Pain in left foot: Secondary | ICD-10-CM | POA: Diagnosis not present

## 2020-08-08 DIAGNOSIS — R600 Localized edema: Secondary | ICD-10-CM | POA: Diagnosis not present

## 2020-09-05 DIAGNOSIS — M79672 Pain in left foot: Secondary | ICD-10-CM | POA: Diagnosis not present

## 2020-09-05 DIAGNOSIS — S92352A Displaced fracture of fifth metatarsal bone, left foot, initial encounter for closed fracture: Secondary | ICD-10-CM | POA: Diagnosis not present

## 2020-09-07 DIAGNOSIS — F419 Anxiety disorder, unspecified: Secondary | ICD-10-CM | POA: Diagnosis not present

## 2020-10-17 DIAGNOSIS — J209 Acute bronchitis, unspecified: Secondary | ICD-10-CM | POA: Diagnosis not present

## 2020-10-17 DIAGNOSIS — H66001 Acute suppurative otitis media without spontaneous rupture of ear drum, right ear: Secondary | ICD-10-CM | POA: Diagnosis not present

## 2020-10-17 DIAGNOSIS — R059 Cough, unspecified: Secondary | ICD-10-CM | POA: Diagnosis not present

## 2020-11-02 DIAGNOSIS — Z86018 Personal history of other benign neoplasm: Secondary | ICD-10-CM | POA: Diagnosis not present

## 2020-11-02 DIAGNOSIS — L821 Other seborrheic keratosis: Secondary | ICD-10-CM | POA: Diagnosis not present

## 2020-11-02 DIAGNOSIS — L814 Other melanin hyperpigmentation: Secondary | ICD-10-CM | POA: Diagnosis not present

## 2020-11-02 DIAGNOSIS — D225 Melanocytic nevi of trunk: Secondary | ICD-10-CM | POA: Diagnosis not present

## 2020-11-14 ENCOUNTER — Ambulatory Visit: Payer: Self-pay | Attending: Internal Medicine

## 2020-11-14 ENCOUNTER — Other Ambulatory Visit (HOSPITAL_BASED_OUTPATIENT_CLINIC_OR_DEPARTMENT_OTHER): Payer: Self-pay | Admitting: Internal Medicine

## 2020-11-14 DIAGNOSIS — Z23 Encounter for immunization: Secondary | ICD-10-CM

## 2020-11-14 NOTE — Progress Notes (Signed)
   Covid-19 Vaccination Clinic  Name:  Anna Barton    MRN: [id number] DOB: [birth year]  11/14/2020  Anna Barton was observed post Covid-19 immunization for 15 minutes without incident. She was provided with Vaccine Information Sheet and instruction to access the V-Safe system.   Anna Barton was instructed to call 911 with any severe reactions post vaccine: Marland Kitchen Difficulty breathing  . Swelling of face and throat  . A fast heartbeat  . A bad rash all over body  . Dizziness and weakness   Immunizations Administered    Name Date Dose VIS Date Route   Moderna Covid-19 Booster Vaccine 11/14/2020  1:42 PM 0.25 mL 08/24/2020 Intramuscular   Manufacturer: Levan Hurst   Lot: [id number]   [place]: 08144-818-56

## 2020-11-15 MED FILL — MODERNA COVID-19 VACCINE 10: 100 | 28 days supply | Qty: 0 | Fill #0

## 2020-12-27 ENCOUNTER — Encounter: Payer: Self-pay | Admitting: Hematology & Oncology

## 2020-12-27 ENCOUNTER — Other Ambulatory Visit: Payer: Self-pay

## 2020-12-27 ENCOUNTER — Inpatient Hospital Stay: Payer: BC Managed Care – PPO | Attending: Hematology & Oncology | Admitting: Hematology & Oncology

## 2020-12-27 ENCOUNTER — Inpatient Hospital Stay: Payer: BC Managed Care – PPO

## 2020-12-27 VITALS — BP 144/60 | HR 80 | Temp 98.7°F | Resp 18 | Wt 156.0 lb

## 2020-12-27 DIAGNOSIS — C50011 Malignant neoplasm of nipple and areola, right female breast: Secondary | ICD-10-CM

## 2020-12-27 DIAGNOSIS — C50611 Malignant neoplasm of axillary tail of right female breast: Secondary | ICD-10-CM

## 2020-12-27 DIAGNOSIS — Z170 Estrogen receptor positive status [ER+]: Secondary | ICD-10-CM | POA: Diagnosis not present

## 2020-12-27 DIAGNOSIS — Z9221 Personal history of antineoplastic chemotherapy: Secondary | ICD-10-CM | POA: Insufficient documentation

## 2020-12-27 DIAGNOSIS — Z853 Personal history of malignant neoplasm of breast: Secondary | ICD-10-CM | POA: Diagnosis not present

## 2020-12-27 DIAGNOSIS — M8000XA Age-related osteoporosis with current pathological fracture, unspecified site, initial encounter for fracture: Secondary | ICD-10-CM

## 2020-12-27 DIAGNOSIS — Z79899 Other long term (current) drug therapy: Secondary | ICD-10-CM | POA: Insufficient documentation

## 2020-12-27 LAB — CBC WITH DIFFERENTIAL (CANCER CENTER ONLY)
Abs Immature Granulocytes: 0.03 10*3/uL (ref 0.00–0.07)
Basophils Absolute: 0.1 10*3/uL (ref 0.0–0.1)
Basophils Relative: 1 %
Eosinophils Absolute: 0.2 10*3/uL (ref 0.0–0.5)
Eosinophils Relative: 3 %
HCT: 41.4 % (ref 36.0–46.0)
Hemoglobin: 13.7 g/dL (ref 12.0–15.0)
Immature Granulocytes: 0 %
Lymphocytes Relative: 22 %
Lymphs Abs: 1.7 10*3/uL (ref 0.7–4.0)
MCH: 27.8 pg (ref 26.0–34.0)
MCHC: 33.1 g/dL (ref 30.0–36.0)
MCV: 84.1 fL (ref 80.0–100.0)
Monocytes Absolute: 0.6 10*3/uL (ref 0.1–1.0)
Monocytes Relative: 8 %
Neutro Abs: 5.3 10*3/uL (ref 1.7–7.7)
Neutrophils Relative %: 66 %
Platelet Count: 351 10*3/uL (ref 150–400)
RBC: 4.92 MIL/uL (ref 3.87–5.11)
RDW: 13.2 % (ref 11.5–15.5)
WBC Count: 8 10*3/uL (ref 4.0–10.5)
nRBC: 0 % (ref 0.0–0.2)

## 2020-12-27 LAB — CMP (CANCER CENTER ONLY)
ALT: 10 U/L (ref 0–44)
AST: 11 U/L — ABNORMAL LOW (ref 15–41)
Albumin: 4.2 g/dL (ref 3.5–5.0)
Alkaline Phosphatase: 79 U/L (ref 38–126)
Anion gap: 6 (ref 5–15)
BUN: 11 mg/dL (ref 8–23)
CO2: 30 mmol/L (ref 22–32)
Calcium: 9.3 mg/dL (ref 8.9–10.3)
Chloride: 101 mmol/L (ref 98–111)
Creatinine: 0.75 mg/dL (ref 0.44–1.00)
GFR, Estimated: >60 mL/min (ref >60)
Glucose, Bld: 134 mg/dL — ABNORMAL HIGH (ref 70–99)
Potassium: 4.1 mmol/L (ref 3.5–5.1)
Sodium: 137 mmol/L (ref 135–145)
Total Bilirubin: 0.7 mg/dL (ref 0.3–1.2)
Total Protein: 6.8 g/dL (ref 6.5–8.1)

## 2020-12-27 LAB — VITAMIN D 25 HYDROXY (VIT D DEFICIENCY, FRACTURES): Vit D, 25-Hydroxy: 38.53 ng/mL (ref 30–100)

## 2020-12-27 LAB — LACTATE DEHYDROGENASE: LDH: 123 U/L (ref 98–192)

## 2020-12-27 NOTE — Progress Notes (Signed)
Hematology and Oncology Follow Up Visit  Anna Barton [id number] 09-08-1956 65 y.o. 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  We see her yearly.  So far, she has had no problems since we last saw her.  Her health is been doing quite well.  She is helping with her 2 granddaughters.  She has had no problems with fatigue or weakness.  She has had no issues with nausea or vomiting.  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  This seems to be doing pretty well.  She has had no obvious exacerbations of this.  She is avoided the coronavirus pandemic.  I think one of her children may have had a impacted onto the family.  She has had no issues with cough.  She has had no headache.  Overall, her performance status is ECOG 0.     Medications:  Current Outpatient Medications:  .  Cholecalciferol 125 MCG (5000 UT) TABS, Take 3,000 mg by mouth every morning., Disp: , Rfl:  .  citalopram (CELEXA) 20 MG tablet, daily. , Disp: , Rfl: 5 .  Olive Leaf Extract 250 MG CAPS, Take 250 mg by mouth in the morning and at bedtime., Disp: , Rfl:  .  Ascorbic Acid (VITAMIN C) 1000 MG tablet, Take 1,000 mg by mouth daily., Disp: , Rfl:  .  ibuprofen (ADVIL,MOTRIN) 800 MG tablet, Take 400 mg by mouth as needed.  (Patient not taking: Reported on 12/27/2020), Disp: , Rfl:  .  Multiple Vitamins-Minerals (WOMENS BONE HEALTH PO), Take 1 tablet by mouth every morning., Disp: , Rfl:  .  zinc gluconate 50 MG tablet, Take 50 mg by mouth daily., Disp: , Rfl:   Allergies:  Allergies  Allergen Reactions  . Peanut-Containing Drug Products     Mouth burning, and tongue swells  . Penicillins Anaphylaxis and Shortness Of Breath  . Codeine Nausea And Vomiting and Nausea Only  . Other     Other reaction(s): Unknown    Past Medical History, Surgical history,  Social history, and Family History were reviewed and updated.  Review of Systems: Review of Systems  Constitutional: Negative.   HENT: Negative.   Eyes: Negative.   Respiratory: Negative.   Cardiovascular: Negative.   Gastrointestinal: Negative.   Genitourinary: Negative.   Musculoskeletal: Negative.   Skin: Negative.   Neurological: Negative.   Endo/Heme/Allergies: Negative.   Psychiatric/Behavioral: Negative.     Physical Exam:  weight is 156 lb (70.8 kg). Her oral temperature is 98.7 F (37.1 C). Her blood pressure is 144/60 (abnormal) and her pulse is 80. Her respiration is 18 and oxygen saturation is 100%.  Physical Exam Vitals reviewed.  HENT:     Head: Normocephalic and atraumatic.  Eyes:     Pupils: Pupils are equal, round, and reactive to light.  Cardiovascular:     Rate and Rhythm: Normal rate and regular rhythm.     Heart sounds: Normal heart sounds.  Pulmonary:     Effort: Pulmonary effort is normal.     Breath sounds: Normal breath sounds.  Abdominal:     General: Bowel sounds are normal.     Palpations: Abdomen is soft.  Musculoskeletal:        General: No tenderness or deformity. Normal range of motion.     Cervical back: Normal range of  motion.  Lymphadenopathy:     Cervical: No cervical adenopathy.  Skin:    General: Skin is warm and dry.     Findings: No erythema or rash.  Neurological:     Mental Status: She is alert and oriented to person, place, and time.  Psychiatric:        Behavior: Behavior normal.        Thought Content: Thought content normal.        Judgment: Judgment normal.      Lab Results  Component Value Date   WBC 8.0 12/27/2020   HGB 13.7 12/27/2020   HCT 41.4 12/27/2020   MCV 84.1 12/27/2020   PLT 351 12/27/2020     Chemistry      Component Value Date/Time   NA 137 12/27/2020 1115   NA 138 06/21/2017 1140   K 4.1 12/27/2020 1115   K 4.0 06/21/2017 1140   CL 101 12/27/2020 1115   CL 99 12/15/2014 1033   CO2 30  12/27/2020 1115   CO2 28 06/21/2017 1140   BUN 11 12/27/2020 1115   BUN 10.5 06/21/2017 1140   CREATININE 0.75 12/27/2020 1115   CREATININE 0.8 06/21/2017 1140      Component Value Date/Time   CALCIUM 9.3 12/27/2020 1115   CALCIUM 9.5 06/21/2017 1140   ALKPHOS 79 12/27/2020 1115   ALKPHOS 88 06/21/2017 1140   AST 11 (L) 12/27/2020 1115   AST 16 06/21/2017 1140   ALT 10 12/27/2020 1115   ALT 14 06/21/2017 1140   BILITOT 0.7 12/27/2020 1115   BILITOT 1.01 06/21/2017 1140     Impression and Plan: Anna Barton is 65 year old white female with a history of stage IIa ductal carcinoma right breast. She underwent systemic chemotherapy in the adjuvant setting. She did receive Cytoxan/Taxotere.She completed this in December 2009.   She received 5 years of Femara. She completed this in December of 2014.  Everything is going quite well for her.  I cannot find any evidence of recurrent disease.  We will plan to get her back in 1 year.    [name], MD 2/22/20221:16 PM

## 2021-03-18 DIAGNOSIS — Z8619 Personal history of other infectious and parasitic diseases: Secondary | ICD-10-CM | POA: Diagnosis not present

## 2021-03-18 DIAGNOSIS — R21 Rash and other nonspecific skin eruption: Secondary | ICD-10-CM | POA: Diagnosis not present

## 2021-07-18 DIAGNOSIS — Z1151 Encounter for screening for human papillomavirus (HPV): Secondary | ICD-10-CM | POA: Diagnosis not present

## 2021-07-18 DIAGNOSIS — Z130 Encounter for screening for diseases of the blood and blood-forming organs and certain disorders involving the immune mechanism: Secondary | ICD-10-CM | POA: Diagnosis not present

## 2021-07-18 DIAGNOSIS — Z124 Encounter for screening for malignant neoplasm of cervix: Secondary | ICD-10-CM | POA: Diagnosis not present

## 2021-07-18 DIAGNOSIS — Z13220 Encounter for screening for lipoid disorders: Secondary | ICD-10-CM | POA: Diagnosis not present

## 2021-07-18 DIAGNOSIS — Z13228 Encounter for screening for other metabolic disorders: Secondary | ICD-10-CM | POA: Diagnosis not present

## 2021-07-18 DIAGNOSIS — Z01419 Encounter for gynecological examination (general) (routine) without abnormal findings: Secondary | ICD-10-CM | POA: Diagnosis not present

## 2021-07-18 DIAGNOSIS — Z6823 Body mass index (BMI) 23.0-23.9, adult: Secondary | ICD-10-CM | POA: Diagnosis not present

## 2021-07-18 DIAGNOSIS — E559 Vitamin D deficiency, unspecified: Secondary | ICD-10-CM | POA: Diagnosis not present

## 2021-07-19 DIAGNOSIS — M25532 Pain in left wrist: Secondary | ICD-10-CM | POA: Diagnosis not present

## 2021-08-17 DIAGNOSIS — O358XX0 Maternal care for other (suspected) fetal abnormality and damage, not applicable or unspecified: Secondary | ICD-10-CM | POA: Diagnosis not present

## 2021-08-17 DIAGNOSIS — O09893 Supervision of other high risk pregnancies, third trimester: Secondary | ICD-10-CM | POA: Diagnosis not present

## 2021-08-17 DIAGNOSIS — O99213 Obesity complicating pregnancy, third trimester: Secondary | ICD-10-CM | POA: Diagnosis not present

## 2021-08-17 DIAGNOSIS — Z3A32 32 weeks gestation of pregnancy: Secondary | ICD-10-CM | POA: Diagnosis not present

## 2021-09-07 DIAGNOSIS — F419 Anxiety disorder, unspecified: Secondary | ICD-10-CM | POA: Diagnosis not present

## 2021-11-09 DIAGNOSIS — L821 Other seborrheic keratosis: Secondary | ICD-10-CM | POA: Diagnosis not present

## 2021-11-09 DIAGNOSIS — L814 Other melanin hyperpigmentation: Secondary | ICD-10-CM | POA: Diagnosis not present

## 2021-11-09 DIAGNOSIS — Z86018 Personal history of other benign neoplasm: Secondary | ICD-10-CM | POA: Diagnosis not present

## 2021-11-09 DIAGNOSIS — D225 Melanocytic nevi of trunk: Secondary | ICD-10-CM | POA: Diagnosis not present

## 2021-12-20 ENCOUNTER — Encounter: Payer: Self-pay | Admitting: Family

## 2021-12-27 ENCOUNTER — Inpatient Hospital Stay (HOSPITAL_BASED_OUTPATIENT_CLINIC_OR_DEPARTMENT_OTHER): Payer: BC Managed Care – PPO | Admitting: Hematology & Oncology

## 2021-12-27 ENCOUNTER — Encounter: Payer: Self-pay | Admitting: Family

## 2021-12-27 ENCOUNTER — Inpatient Hospital Stay: Payer: BC Managed Care – PPO | Attending: Hematology & Oncology

## 2021-12-27 ENCOUNTER — Other Ambulatory Visit: Payer: Self-pay

## 2021-12-27 ENCOUNTER — Encounter: Payer: Self-pay | Admitting: Hematology & Oncology

## 2021-12-27 VITALS — BP 108/69 | HR 75 | Temp 98.3°F | Resp 18 | Ht 66.93 in | Wt 151.8 lb

## 2021-12-27 DIAGNOSIS — Z86000 Personal history of in-situ neoplasm of breast: Secondary | ICD-10-CM | POA: Insufficient documentation

## 2021-12-27 DIAGNOSIS — Z9221 Personal history of antineoplastic chemotherapy: Secondary | ICD-10-CM | POA: Diagnosis not present

## 2021-12-27 DIAGNOSIS — C50611 Malignant neoplasm of axillary tail of right female breast: Secondary | ICD-10-CM

## 2021-12-27 DIAGNOSIS — Z853 Personal history of malignant neoplasm of breast: Secondary | ICD-10-CM | POA: Diagnosis not present

## 2021-12-27 DIAGNOSIS — Z170 Estrogen receptor positive status [ER+]: Secondary | ICD-10-CM | POA: Diagnosis not present

## 2021-12-27 DIAGNOSIS — C50911 Malignant neoplasm of unspecified site of right female breast: Secondary | ICD-10-CM

## 2021-12-27 DIAGNOSIS — Q8500 Neurofibromatosis, unspecified: Secondary | ICD-10-CM | POA: Insufficient documentation

## 2021-12-27 DIAGNOSIS — Z8500 Personal history of malignant neoplasm of unspecified digestive organ: Secondary | ICD-10-CM | POA: Diagnosis not present

## 2021-12-27 LAB — CBC WITH DIFFERENTIAL (CANCER CENTER ONLY)
Abs Immature Granulocytes: 0.04 10*3/uL (ref 0.00–0.07)
Basophils Absolute: 0.1 10*3/uL (ref 0.0–0.1)
Basophils Relative: 1 %
Eosinophils Absolute: 0.4 10*3/uL (ref 0.0–0.5)
Eosinophils Relative: 5 %
HCT: 40.5 % (ref 36.0–46.0)
Hemoglobin: 13.4 g/dL (ref 12.0–15.0)
Immature Granulocytes: 1 %
Lymphocytes Relative: 24 %
Lymphs Abs: 2 10*3/uL (ref 0.7–4.0)
MCH: 28.1 pg (ref 26.0–34.0)
MCHC: 33.1 g/dL (ref 30.0–36.0)
MCV: 84.9 fL (ref 80.0–100.0)
Monocytes Absolute: 0.7 10*3/uL (ref 0.1–1.0)
Monocytes Relative: 9 %
Neutro Abs: 4.9 10*3/uL (ref 1.7–7.7)
Neutrophils Relative %: 60 %
Platelet Count: 288 10*3/uL (ref 150–400)
RBC: 4.77 MIL/uL (ref 3.87–5.11)
RDW: 13 % (ref 11.5–15.5)
WBC Count: 8.1 10*3/uL (ref 4.0–10.5)
nRBC: 0 % (ref 0.0–0.2)

## 2021-12-27 LAB — CMP (CANCER CENTER ONLY)
ALT: 10 U/L (ref 0–44)
AST: 12 U/L — ABNORMAL LOW (ref 15–41)
Albumin: 3.7 g/dL (ref 3.5–5.0)
Alkaline Phosphatase: 73 U/L (ref 38–126)
Anion gap: 5 (ref 5–15)
BUN: 12 mg/dL (ref 8–23)
CO2: 32 mmol/L (ref 22–32)
Calcium: 8.7 mg/dL — ABNORMAL LOW (ref 8.9–10.3)
Chloride: 102 mmol/L (ref 98–111)
Creatinine: 0.73 mg/dL (ref 0.44–1.00)
GFR, Estimated: >60 mL/min (ref >60)
Glucose, Bld: 90 mg/dL (ref 70–99)
Potassium: 4.4 mmol/L (ref 3.5–5.1)
Sodium: 139 mmol/L (ref 135–145)
Total Bilirubin: 0.9 mg/dL (ref 0.3–1.2)
Total Protein: 6.2 g/dL — ABNORMAL LOW (ref 6.5–8.1)

## 2021-12-27 LAB — LACTATE DEHYDROGENASE: LDH: 114 U/L (ref 98–192)

## 2021-12-27 NOTE — Progress Notes (Signed)
Hematology and Oncology Follow Up Visit  Anna Barton [id number] 1956/09/30 66 y.o. 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 is back for followup.  We see her yearly.  She been doing pretty well.  She has had a son and daughter-in-law in 2 granddaughters live with him since November.  A house is being made ready for her son.  Hopefully this will be ready in 2 weeks.  Her husband is doing well.  He has had his issues with cardiac disease but this so far has been not a problem.  There is been no problems with COVID.  I think she may have had COVID a year ago.  She did have Influenza in the fall.  She got through this.  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  Thankfully, this has not been much of a problem for her.  She has had no change in bowel or bladder habits.  She has had no cough or shortness of breath.  There is been no nausea or vomiting.  Overall, I would say her performance status is probably ECOG 1.       Medications:  Current Outpatient Medications:    Ascorbic Acid (VITAMIN C) 1000 MG tablet, Take 1,650 mg by mouth daily. Take Liposomal Vitamin C 1650 mg by mouth daily., Disp: , Rfl:    b complex vitamins capsule, Take 1 capsule by mouth daily., Disp: , Rfl:    citalopram (CELEXA) 20 MG tablet, daily. , Disp: , Rfl: 5   ibuprofen (ADVIL,MOTRIN) 800 MG tablet, Take 400 mg by mouth as needed., Disp: , Rfl:    Olive Leaf Extract 250 MG CAPS, Take 250 mg by mouth in the morning and at bedtime., Disp: , Rfl:    OVER THE COUNTER MEDICATION, Take 1 tablet by mouth daily. Liposomal D3 + K   125 mcg/D  100 mcg/K, Disp: , Rfl:   Allergies:  Allergies  Allergen Reactions   Peanut-Containing Drug Products Swelling    Mouth burning, and tongue swells   Penicillins Anaphylaxis and Shortness Of Breath   Codeine Nausea And Vomiting and  Nausea Only   Other Other (See Comments)    Unknown    Past Medical History, Surgical history, Social history, and Family History were reviewed and updated.  Review of Systems: Review of Systems  Constitutional: Negative.   HENT: Negative.    Eyes: Negative.   Respiratory: Negative.    Cardiovascular: Negative.   Gastrointestinal: Negative.   Genitourinary: Negative.   Musculoskeletal: Negative.   Skin: Negative.   Neurological: Negative.   Endo/Heme/Allergies: Negative.   Psychiatric/Behavioral: Negative.     Physical Exam:  height is 5' 6.93" (1.7 m) and weight is 151 lb 12.8 oz (68.9 kg). Her oral temperature is 98.3 F (36.8 C). Her blood pressure is 108/69 and her pulse is 75. Her respiration is 18 and oxygen saturation is 98%.  Physical Exam Vitals reviewed.  HENT:     Head: Normocephalic and atraumatic.  Eyes:     Pupils: Pupils are equal, round, and reactive to light.  Cardiovascular:     Rate and Rhythm: Normal rate and regular rhythm.     Heart sounds: Normal heart sounds.  Pulmonary:     Effort: Pulmonary effort is normal.     Breath sounds: Normal breath sounds.  Abdominal:  General: Bowel sounds are normal.     Palpations: Abdomen is soft.  Musculoskeletal:        General: No tenderness or deformity. Normal range of motion.     Cervical back: Normal range of motion.  Lymphadenopathy:     Cervical: No cervical adenopathy.  Skin:    General: Skin is warm and dry.     Findings: No erythema or rash.  Neurological:     Mental Status: She is alert and oriented to person, place, and time.  Psychiatric:        Behavior: Behavior normal.        Thought Content: Thought content normal.        Judgment: Judgment normal.     Lab Results  Component Value Date   WBC 8.1 12/27/2021   HGB 13.4 12/27/2021   HCT 40.5 12/27/2021   MCV 84.9 12/27/2021   PLT 288 12/27/2021     Chemistry      Component Value Date/Time   NA 139 12/27/2021 1322   NA 138  06/21/2017 1140   K 4.4 12/27/2021 1322   K 4.0 06/21/2017 1140   CL 102 12/27/2021 1322   CL 99 12/15/2014 1033   CO2 32 12/27/2021 1322   CO2 28 06/21/2017 1140   BUN 12 12/27/2021 1322   BUN 10.5 06/21/2017 1140   CREATININE 0.73 12/27/2021 1322   CREATININE 0.8 06/21/2017 1140      Component Value Date/Time   CALCIUM 8.7 (L) 12/27/2021 1322   CALCIUM 9.5 06/21/2017 1140   ALKPHOS 73 12/27/2021 1322   ALKPHOS 88 06/21/2017 1140   AST 12 (L) 12/27/2021 1322   AST 16 06/21/2017 1140   ALT 10 12/27/2021 1322   ALT 14 06/21/2017 1140   BILITOT 0.9 12/27/2021 1322   BILITOT 1.01 06/21/2017 1140     Impression and Plan: Anna Barton is 66 year old white female with a history of stage IIa ductal carcinoma right breast. She underwent systemic chemotherapy in the adjuvant setting. She did receive Cytoxan/Taxotere.She completed this in December 2009.   She received 5 years of Femara. She completed this in December of 2014.  Everything is going quite well for her.  I cannot find any evidence of recurrent disease.  We will plan to get her back in 1 year.    [name], MD 2/22/20232:20 PM

## 2022-04-23 DIAGNOSIS — H524 Presbyopia: Secondary | ICD-10-CM | POA: Diagnosis not present

## 2022-08-27 DIAGNOSIS — Z1272 Encounter for screening for malignant neoplasm of vagina: Secondary | ICD-10-CM | POA: Diagnosis not present

## 2022-08-27 DIAGNOSIS — Z01419 Encounter for gynecological examination (general) (routine) without abnormal findings: Secondary | ICD-10-CM | POA: Diagnosis not present

## 2022-08-27 DIAGNOSIS — Z0142 Encounter for cervical smear to confirm findings of recent normal smear following initial abnormal smear: Secondary | ICD-10-CM | POA: Diagnosis not present

## 2022-08-27 DIAGNOSIS — Z1151 Encounter for screening for human papillomavirus (HPV): Secondary | ICD-10-CM | POA: Diagnosis not present

## 2022-08-27 DIAGNOSIS — Z124 Encounter for screening for malignant neoplasm of cervix: Secondary | ICD-10-CM | POA: Diagnosis not present

## 2022-09-06 DIAGNOSIS — Z853 Personal history of malignant neoplasm of breast: Secondary | ICD-10-CM | POA: Diagnosis not present

## 2022-09-06 DIAGNOSIS — Z23 Encounter for immunization: Secondary | ICD-10-CM | POA: Diagnosis not present

## 2022-09-06 DIAGNOSIS — F419 Anxiety disorder, unspecified: Secondary | ICD-10-CM | POA: Diagnosis not present

## 2022-09-06 DIAGNOSIS — F5101 Primary insomnia: Secondary | ICD-10-CM | POA: Diagnosis not present

## 2022-11-12 DIAGNOSIS — L821 Other seborrheic keratosis: Secondary | ICD-10-CM | POA: Diagnosis not present

## 2022-11-12 DIAGNOSIS — D225 Melanocytic nevi of trunk: Secondary | ICD-10-CM | POA: Diagnosis not present

## 2022-11-12 DIAGNOSIS — Z86018 Personal history of other benign neoplasm: Secondary | ICD-10-CM | POA: Diagnosis not present

## 2022-11-12 DIAGNOSIS — L814 Other melanin hyperpigmentation: Secondary | ICD-10-CM | POA: Diagnosis not present

## 2022-11-29 DIAGNOSIS — H5203 Hypermetropia, bilateral: Secondary | ICD-10-CM | POA: Diagnosis not present

## 2022-12-07 ENCOUNTER — Encounter: Payer: Self-pay | Admitting: Family

## 2022-12-24 DIAGNOSIS — K080 Exfoliation of teeth due to systemic causes: Secondary | ICD-10-CM | POA: Diagnosis not present

## 2022-12-27 ENCOUNTER — Encounter: Payer: Self-pay | Admitting: Family

## 2022-12-27 ENCOUNTER — Inpatient Hospital Stay: Payer: Medicare Other | Attending: Hematology & Oncology

## 2022-12-27 ENCOUNTER — Other Ambulatory Visit: Payer: Self-pay

## 2022-12-27 ENCOUNTER — Encounter: Payer: Self-pay | Admitting: Hematology & Oncology

## 2022-12-27 ENCOUNTER — Inpatient Hospital Stay (HOSPITAL_BASED_OUTPATIENT_CLINIC_OR_DEPARTMENT_OTHER): Payer: Medicare Other | Admitting: Hematology & Oncology

## 2022-12-27 VITALS — BP 133/69 | HR 78 | Temp 98.6°F | Resp 18 | Ht 66.0 in | Wt 155.0 lb

## 2022-12-27 DIAGNOSIS — Z86000 Personal history of in-situ neoplasm of breast: Secondary | ICD-10-CM | POA: Insufficient documentation

## 2022-12-27 DIAGNOSIS — Z853 Personal history of malignant neoplasm of breast: Secondary | ICD-10-CM | POA: Diagnosis not present

## 2022-12-27 DIAGNOSIS — Z170 Estrogen receptor positive status [ER+]: Secondary | ICD-10-CM

## 2022-12-27 DIAGNOSIS — M818 Other osteoporosis without current pathological fracture: Secondary | ICD-10-CM

## 2022-12-27 DIAGNOSIS — Q8500 Neurofibromatosis, unspecified: Secondary | ICD-10-CM | POA: Insufficient documentation

## 2022-12-27 LAB — CMP (CANCER CENTER ONLY)
ALT: 13 U/L (ref 0–44)
AST: 13 U/L — ABNORMAL LOW (ref 15–41)
Albumin: 4.4 g/dL (ref 3.5–5.0)
Alkaline Phosphatase: 75 U/L (ref 38–126)
Anion gap: 6 (ref 5–15)
BUN: 10 mg/dL (ref 8–23)
CO2: 32 mmol/L (ref 22–32)
Calcium: 9.8 mg/dL (ref 8.9–10.3)
Chloride: 102 mmol/L (ref 98–111)
Creatinine: 0.75 mg/dL (ref 0.44–1.00)
GFR, Estimated: >60 mL/min (ref >60)
Glucose, Bld: 107 mg/dL — ABNORMAL HIGH (ref 70–99)
Potassium: 4.3 mmol/L (ref 3.5–5.1)
Sodium: 140 mmol/L (ref 135–145)
Total Bilirubin: 0.7 mg/dL (ref 0.3–1.2)
Total Protein: 6.9 g/dL (ref 6.5–8.1)

## 2022-12-27 LAB — CBC WITH DIFFERENTIAL (CANCER CENTER ONLY)
Abs Immature Granulocytes: 0.04 10*3/uL (ref 0.00–0.07)
Basophils Absolute: 0.1 10*3/uL (ref 0.0–0.1)
Basophils Relative: 2 %
Eosinophils Absolute: 0.7 10*3/uL — ABNORMAL HIGH (ref 0.0–0.5)
Eosinophils Relative: 7 %
HCT: 43.1 % (ref 36.0–46.0)
Hemoglobin: 13.9 g/dL (ref 12.0–15.0)
Immature Granulocytes: 0 %
Lymphocytes Relative: 25 %
Lymphs Abs: 2.3 10*3/uL (ref 0.7–4.0)
MCH: 27.6 pg (ref 26.0–34.0)
MCHC: 32.3 g/dL (ref 30.0–36.0)
MCV: 85.5 fL (ref 80.0–100.0)
Monocytes Absolute: 0.7 10*3/uL (ref 0.1–1.0)
Monocytes Relative: 8 %
Neutro Abs: 5.4 10*3/uL (ref 1.7–7.7)
Neutrophils Relative %: 58 %
Platelet Count: 317 10*3/uL (ref 150–400)
RBC: 5.04 MIL/uL (ref 3.87–5.11)
RDW: 13.1 % (ref 11.5–15.5)
WBC Count: 9.3 10*3/uL (ref 4.0–10.5)
nRBC: 0 % (ref 0.0–0.2)

## 2022-12-27 LAB — LACTATE DEHYDROGENASE: LDH: 117 U/L (ref 98–192)

## 2022-12-27 NOTE — Progress Notes (Signed)
Hematology and Oncology Follow Up Visit  SUMMAH Barton [id number] 07-22-1956 67 y.o. 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 is back for followup.  We see her yearly.  As always, she is doing quite well.  She is involved with her granddaughters.  She truly enjoys this.  She has had no complaints.  There is been no problems since we saw her a year ago.  Thankfully, she has had no problems with COVID.    She actually will be going down to [place] with a lot of her family as her father is going to have his 80nd birthday and he would like to have pictures made with the family.  There has been no issues with nausea or vomiting.  She has had no problems with cough or shortness of breath.  She has had no change in bowel or bladder habits.  She has had no bleeding.  There is been no rashes.  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  I will think this really has been a problem for her.  Overall, I would say that her performance status is ECOG 0.         Medications:  Current Outpatient Medications:    Ascorbic Acid (VITAMIN C) 1000 MG tablet, Take 1,650 mg by mouth daily. Take Liposomal Vitamin C 1650 mg by mouth daily., Disp: , Rfl:    b complex vitamins capsule, Take 1 capsule by mouth daily., Disp: , Rfl:    citalopram (CELEXA) 20 MG tablet, daily. , Disp: , Rfl: 5   ibuprofen (ADVIL,MOTRIN) 800 MG tablet, Take 400 mg by mouth as needed., Disp: , Rfl:    Olive Leaf Extract 250 MG CAPS, Take 250 mg by mouth in the morning and at bedtime., Disp: , Rfl:    OVER THE COUNTER MEDICATION, Take 1 tablet by mouth daily. Liposomal D3 + K   125 mcg/D  100 mcg/K, Disp: , Rfl:   Allergies:  Allergies  Allergen Reactions   Peanut-Containing Drug Products Swelling    Mouth burning, and tongue swells   Penicillins Anaphylaxis and Shortness Of Breath   Codeine  Nausea And Vomiting and Nausea Only   Other Other (See Comments)    Unknown    Past Medical History, Surgical history, Social history, and Family History were reviewed and updated.  Review of Systems: Review of Systems  Constitutional: Negative.   HENT: Negative.    Eyes: Negative.   Respiratory: Negative.    Cardiovascular: Negative.   Gastrointestinal: Negative.   Genitourinary: Negative.   Musculoskeletal: Negative.   Skin: Negative.   Neurological: Negative.   Endo/Heme/Allergies: Negative.   Psychiatric/Behavioral: Negative.      Physical Exam:  height is 5' 6"$  (1.676 m) and weight is 155 lb (70.3 kg). Her oral temperature is 98.6 F (37 C). Her blood pressure is 133/69 and her pulse is 78. Her respiration is 18.  Physical Exam Vitals reviewed.  HENT:     Head: Normocephalic and atraumatic.  Eyes:     Pupils: Pupils are equal, round, and reactive to light.  Cardiovascular:     Rate and Rhythm: Normal rate and regular rhythm.     Heart sounds: Normal heart sounds.  Pulmonary:     Effort: Pulmonary effort is normal.     Breath sounds: Normal breath sounds.  Abdominal:  General: Bowel sounds are normal.     Palpations: Abdomen is soft.  Musculoskeletal:        General: No tenderness or deformity. Normal range of motion.     Cervical back: Normal range of motion.  Lymphadenopathy:     Cervical: No cervical adenopathy.  Skin:    General: Skin is warm and dry.     Findings: No erythema or rash.  Neurological:     Mental Status: She is alert and oriented to person, place, and time.  Psychiatric:        Behavior: Behavior normal.        Thought Content: Thought content normal.        Judgment: Judgment normal.      Lab Results  Component Value Date   WBC 9.3 12/27/2022   HGB 13.9 12/27/2022   HCT 43.1 12/27/2022   MCV 85.5 12/27/2022   PLT 317 12/27/2022     Chemistry      Component Value Date/Time   NA 140 12/27/2022 1324   NA 138 06/21/2017  1140   K 4.3 12/27/2022 1324   K 4.0 06/21/2017 1140   CL 102 12/27/2022 1324   CL 99 12/15/2014 1033   CO2 32 12/27/2022 1324   CO2 28 06/21/2017 1140   BUN 10 12/27/2022 1324   BUN 10.5 06/21/2017 1140   CREATININE 0.75 12/27/2022 1324   CREATININE 0.8 06/21/2017 1140      Component Value Date/Time   CALCIUM 9.8 12/27/2022 1324   CALCIUM 9.5 06/21/2017 1140   ALKPHOS 75 12/27/2022 1324   ALKPHOS 88 06/21/2017 1140   AST 13 (L) 12/27/2022 1324   AST 16 06/21/2017 1140   ALT 13 12/27/2022 1324   ALT 14 06/21/2017 1140   BILITOT 0.7 12/27/2022 1324   BILITOT 1.01 06/21/2017 1140     Impression and Plan: Anna Barton is 67 year old white female with a history of stage IIa ductal carcinoma right breast. She underwent systemic chemotherapy in the adjuvant setting. She did receive Cytoxan/Taxotere.She completed this in December 2009.   She received 5 years of Femara. She completed this in December of 2014.  She is in need of a bone density test.  We will go and get this set up.  We will plan to get her back in 1 year.    [name], MD 2/22/20242:14 PM

## 2023-01-01 DIAGNOSIS — F419 Anxiety disorder, unspecified: Secondary | ICD-10-CM | POA: Diagnosis not present

## 2023-01-01 DIAGNOSIS — Z23 Encounter for immunization: Secondary | ICD-10-CM | POA: Diagnosis not present

## 2023-01-01 DIAGNOSIS — E7800 Pure hypercholesterolemia, unspecified: Secondary | ICD-10-CM | POA: Diagnosis not present

## 2023-01-01 DIAGNOSIS — Z0000 Encounter for general adult medical examination without abnormal findings: Secondary | ICD-10-CM | POA: Diagnosis not present

## 2023-01-07 ENCOUNTER — Ambulatory Visit (HOSPITAL_BASED_OUTPATIENT_CLINIC_OR_DEPARTMENT_OTHER)
Admission: RE | Admit: 2023-01-07 | Discharge: 2023-01-07 | Disposition: A | Discharge status: Home / Self Care | Payer: Medicare Other | Source: Ambulatory Visit | Attending: Hematology & Oncology | Admitting: Hematology & Oncology

## 2023-01-07 DIAGNOSIS — M85852 Other specified disorders of bone density and structure, left thigh: Secondary | ICD-10-CM | POA: Diagnosis not present

## 2023-01-07 DIAGNOSIS — M818 Other osteoporosis without current pathological fracture: Secondary | ICD-10-CM | POA: Diagnosis not present

## 2023-01-07 DIAGNOSIS — T386X5A Adverse effect of antigonadotrophins, antiestrogens, antiandrogens, not elsewhere classified, initial encounter: Secondary | ICD-10-CM | POA: Diagnosis not present

## 2023-01-08 ENCOUNTER — Telehealth: Payer: Self-pay

## 2023-01-08 NOTE — Telephone Encounter (Signed)
-----   Message from [name], MD sent at 01/07/2023  7:00 PM EST ----- Please call and let her know that the bone density test does show some osteopenia.  Is she is taking vitamin D?  Thanks.  Laurey Arrow

## 2023-01-30 ENCOUNTER — Other Ambulatory Visit: Payer: Self-pay | Admitting: Hematology & Oncology

## 2023-01-30 DIAGNOSIS — M8000XA Age-related osteoporosis with current pathological fracture, unspecified site, initial encounter for fracture: Secondary | ICD-10-CM

## 2023-02-05 ENCOUNTER — Inpatient Hospital Stay: Payer: Medicare Other | Attending: Hematology & Oncology

## 2023-02-05 DIAGNOSIS — M8000XA Age-related osteoporosis with current pathological fracture, unspecified site, initial encounter for fracture: Secondary | ICD-10-CM

## 2023-02-05 DIAGNOSIS — Z79811 Long term (current) use of aromatase inhibitors: Secondary | ICD-10-CM | POA: Diagnosis not present

## 2023-02-05 DIAGNOSIS — Z86000 Personal history of in-situ neoplasm of breast: Secondary | ICD-10-CM | POA: Diagnosis not present

## 2023-02-05 LAB — VITAMIN D 25 HYDROXY (VIT D DEFICIENCY, FRACTURES): Vit D, 25-Hydroxy: 66.2 ng/mL (ref 30–100)

## 2023-02-06 ENCOUNTER — Encounter: Payer: Self-pay | Admitting: *Deleted

## 2023-03-11 ENCOUNTER — Encounter: Payer: Self-pay | Admitting: Gastroenterology

## 2023-03-21 DIAGNOSIS — K080 Exfoliation of teeth due to systemic causes: Secondary | ICD-10-CM | POA: Diagnosis not present

## 2023-05-06 ENCOUNTER — Encounter: Payer: Self-pay | Admitting: Family

## 2023-05-06 DIAGNOSIS — E7800 Pure hypercholesterolemia, unspecified: Secondary | ICD-10-CM | POA: Insufficient documentation

## 2023-05-16 DIAGNOSIS — K080 Exfoliation of teeth due to systemic causes: Secondary | ICD-10-CM | POA: Diagnosis not present

## 2023-05-20 ENCOUNTER — Ambulatory Visit (AMBULATORY_SURGERY_CENTER): Payer: Medicare Other

## 2023-05-20 ENCOUNTER — Encounter: Payer: Self-pay | Admitting: Gastroenterology

## 2023-05-20 VITALS — Ht 66.0 in | Wt 153.0 lb

## 2023-05-20 DIAGNOSIS — Z86010 Personal history of colonic polyps: Secondary | ICD-10-CM

## 2023-05-20 DIAGNOSIS — Z83719 Family history of colon polyps, unspecified: Secondary | ICD-10-CM

## 2023-05-20 MED ORDER — NA SULFATE-K SULFATE-MG SULF 17.5-3.13-1.6 GM/177ML PO SOLN: 1.0000 | Freq: Once | ORAL | 0 refills | Status: AC

## 2023-05-20 NOTE — Progress Notes (Signed)
No egg or soy allergy known to patient  No issues known to pt with past sedation with any surgeries or procedures Patient denies ever being told they had issues or difficulty with intubation  No FH of Malignant Hyperthermia Pt is not on diet pills Pt is not on  home 02  Pt is not on blood thinners  Pt denies issues with constipation  No A fib or A flutter Have any cardiac testing pending--no  LOA: independent  Prep: suprep   Patient's chart reviewed by John Nulty CNRA prior to previsit and patient appropriate for the [facility].  Previsit completed and red dot placed by patient's name on their procedure day (on provider's schedule).     PV competed with patient. Prep instructions sent via mychart and home address. Goodrx coupon for [facility] provided to use for price reduction if needed.  

## 2023-05-21 DIAGNOSIS — H524 Presbyopia: Secondary | ICD-10-CM | POA: Diagnosis not present

## 2023-06-13 DIAGNOSIS — K080 Exfoliation of teeth due to systemic causes: Secondary | ICD-10-CM | POA: Diagnosis not present

## 2023-06-20 ENCOUNTER — Encounter: Payer: Self-pay | Admitting: Gastroenterology

## 2023-06-20 ENCOUNTER — Ambulatory Visit (AMBULATORY_SURGERY_CENTER): Payer: Medicare Other | Admitting: Gastroenterology

## 2023-06-20 VITALS — BP 143/61 | HR 67 | Temp 98.4°F | Resp 17 | Ht 66.0 in | Wt 152.2 lb

## 2023-06-20 DIAGNOSIS — Z86010 Personal history of colonic polyps: Secondary | ICD-10-CM

## 2023-06-20 DIAGNOSIS — D122 Benign neoplasm of ascending colon: Secondary | ICD-10-CM

## 2023-06-20 DIAGNOSIS — Z09 Encounter for follow-up examination after completed treatment for conditions other than malignant neoplasm: Secondary | ICD-10-CM

## 2023-06-20 DIAGNOSIS — Z83719 Family history of colon polyps, unspecified: Secondary | ICD-10-CM

## 2023-06-20 MED ORDER — SODIUM CHLORIDE 0.9 % IV SOLN: 500.0000 mL | Freq: Once | INTRAVENOUS | Status: DC

## 2023-06-20 NOTE — Patient Instructions (Signed)
Handout on hemorrhoids and polyps given to patient Await pathology results Resume previous diet and continue present medications Repeat colonoscopy in 3 years for surveillance   YOU HAD AN ENDOSCOPIC PROCEDURE TODAY AT THE [facility]:   Refer to the procedure report that was given to you for any specific questions about what was found during the examination.  If the procedure report does not answer your questions, please call your gastroenterologist to clarify.  If you requested that your care partner not be given the details of your procedure findings, then the procedure report has been included in a sealed envelope for you to review at your convenience later.  YOU SHOULD EXPECT: Some feelings of bloating in the abdomen. Passage of more gas than usual.  Walking can help get rid of the air that was put into your GI tract during the procedure and reduce the bloating. If you had a lower endoscopy (such as a colonoscopy or flexible sigmoidoscopy) you may notice spotting of blood in your stool or on the toilet paper. If you underwent a bowel prep for your procedure, you may not have a normal bowel movement for a few days.  Please Note:  You might notice some irritation and congestion in your nose or some drainage.  This is from the oxygen used during your procedure.  There is no need for concern and it should clear up in a day or so.  SYMPTOMS TO REPORT IMMEDIATELY:  Following lower endoscopy (colonoscopy or flexible sigmoidoscopy):  Excessive amounts of blood in the stool  Significant tenderness or worsening of abdominal pains  Swelling of the abdomen that is new, acute  Fever of 100F or higher   For urgent or emergent issues, a gastroenterologist can be reached at any hour by calling [phone]. Do not use MyChart messaging for urgent concerns.    DIET:  We do recommend a small meal at first, but then you may proceed to your regular diet.  Drink plenty of fluids but you  should avoid alcoholic beverages for 24 hours.  ACTIVITY:  You should plan to take it easy for the rest of today and you should NOT DRIVE or use heavy machinery until tomorrow (because of the sedation medicines used during the test).    FOLLOW UP: Our staff will call the number listed on your records the next business day following your procedure.  We will call around 7:15- 8:00 am to check on you and address any questions or concerns that you may have regarding the information given to you following your procedure. If we do not reach you, we will leave a message.     If any biopsies were taken you will be contacted by phone or by letter within the next 1-3 weeks.  Please call us at [phone] if you have not heard about the biopsies in 3 weeks.    SIGNATURES/CONFIDENTIALITY: You and/or your care partner have signed paperwork which will be entered into your electronic medical record.  These signatures attest to the fact that that the information above on your After Visit Summary has been reviewed and is understood.  Full responsibility of the confidentiality of this discharge information lies with you and/or your care-partner.

## 2023-06-20 NOTE — Op Note (Signed)
[facility] Patient Name: Anna Barton Procedure Date: 06/20/2023 8:41 AM MRN: [id number] Endoscopist: Napoleon Form , MD, [id number] Age: 67 Referring MD:  Date of Birth: [birth year] Gender: Female Account #: [id number] Procedure:                Colonoscopy Indications:              High risk colon cancer surveillance: Personal                            history of colonic polyps, High risk colon cancer                            surveillance: Personal history of adenoma less than                            10 mm in size Medicines:                Monitored Anesthesia Care Procedure:                Pre-Anesthesia Assessment:                           - Prior to the procedure, a History and Physical                            was performed, and patient medications and                            allergies were reviewed. The patient's tolerance of                            previous anesthesia was also reviewed. The risks                            and benefits of the procedure and the sedation                            options and risks were discussed with the patient.                            All questions were answered, and informed consent                            was obtained. Prior Anticoagulants: The patient has                            taken no anticoagulant or antiplatelet agents. ASA                            Grade Assessment: II - A patient with mild systemic                            disease. After reviewing the risks and benefits,  the patient was deemed in satisfactory condition to                            undergo the procedure.                           After obtaining informed consent, the colonoscope                            was passed under direct vision. Throughout the                            procedure, the patient's blood pressure, pulse, and                            oxygen saturations were monitored  continuously. The                            Olympus Scope [id number] was introduced through the                            anus and advanced to the the cecum, identified by                            appendiceal orifice and ileocecal valve. The                            colonoscopy was performed without difficulty. The                            patient tolerated the procedure well. The quality                            of the bowel preparation was good. The ileocecal                            valve, appendiceal orifice, and rectum were                            photographed. Scope In: 8:46:11 AM Scope Out: 9:04:40 AM Scope Withdrawal Time: 0 hours 12 minutes 52 seconds  Total Procedure Duration: 0 hours 18 minutes 29 seconds  Findings:                 The perianal and digital rectal examinations were                            normal.                           A 3 mm polyp was found in the ascending colon. The                            polyp was sessile. The polyp was removed with a  cold snare. Resection and retrieval were complete.                           A 15 mm polyp was found in the ascending colon. The                            polyp was sessile. The polyp was removed with a                            piecemeal technique using a cold snare. Resection                            and retrieval were complete.                           Non-bleeding external and internal hemorrhoids were                            found during retroflexion. The hemorrhoids were                            small. Complications:            No immediate complications. Estimated Blood Loss:     Estimated blood loss was minimal. Impression:               - One 3 mm polyp in the ascending colon, removed                            with a cold snare. Resected and retrieved.                           - One 15 mm polyp in the ascending colon, removed                             piecemeal using a cold snare. Resected and                            retrieved.                           - Non-bleeding external and internal hemorrhoids. Recommendation:           - Patient has a contact number available for                            emergencies. The signs and symptoms of potential                            delayed complications were discussed with the                            patient. Return to normal activities tomorrow.  Written discharge instructions were provided to the                            patient.                           - Resume previous diet.                           - Continue present medications.                           - Await pathology results.                           - Repeat colonoscopy in 3 years for surveillance                            based on pathology results. Napoleon Form, MD 06/20/2023 9:10:26 AM This report has been signed electronically.

## 2023-06-20 NOTE — Progress Notes (Signed)
Pt's states no medical or surgical changes since previsit or office visit. 

## 2023-06-20 NOTE — Progress Notes (Signed)
Sedate, gd SR, tolerated procedure well, VSS, report to RN 

## 2023-06-20 NOTE — Progress Notes (Signed)
[facility] History and Physical   Primary Care Physician:  Noberto Retort, MD   Reason for Procedure:  History of adenomatous colon polyps  Plan:    Surveillance colonoscopy with possible interventions as needed     HPI: Anna Barton is a very pleasant 67 y.o. female here for surveillance colonoscopy. Denies any nausea, vomiting, abdominal pain, melena or bright red blood per rectum  The risks and benefits as well as alternatives of endoscopic procedure(s) have been discussed and reviewed. All questions answered. The patient agrees to proceed.    Past Medical History:  Diagnosis Date   Breast CA (HCC) 2009   right breast/ 6 rounds of chemo   Neurofibromatosis type 1-like syndrome     Past Surgical History:  Procedure Laterality Date   BREAST RECONSTRUCTION  12/2008   Bil   CHOLECYSTECTOMY     in her 20's   COLONOSCOPY     KNEE ARTHROSCOPY WITH EXCISION BAKER'S CYST     Bil/ as a child   MASTECTOMY Bilateral 06/2008   TUBAL LIGATION  1994    Prior to Admission medications   Medication Sig Start Date End Date Taking? Authorizing Provider  Ascorbic Acid (VITAMIN C) 1000 MG tablet Take 1,650 mg by mouth daily. Take Liposomal Vitamin C 1650 mg by mouth daily.   Yes [provider]  b complex vitamins capsule Take 1 capsule by mouth daily.   Yes [provider]  citalopram (CELEXA) 20 MG tablet daily.  12/12/15  Yes [provider]  ibuprofen (ADVIL,MOTRIN) 800 MG tablet Take 400 mg by mouth as needed. 05/27/13  Yes [provider]  Magnesium 250 MG TABS Take by mouth daily.   Yes [provider]  Olive Leaf Extract 250 MG CAPS Take 250 mg by mouth daily.   Yes [provider]  OVER THE COUNTER MEDICATION Take 1 tablet by mouth daily. Liposomal D3 + K   125 mcg/D  100 mcg/K   Yes [provider]    Current Outpatient Medications  Medication Sig Dispense Refill   Ascorbic Acid (VITAMIN C) 1000  MG tablet Take 1,650 mg by mouth daily. Take Liposomal Vitamin C 1650 mg by mouth daily.     b complex vitamins capsule Take 1 capsule by mouth daily.     citalopram (CELEXA) 20 MG tablet daily.   5   ibuprofen (ADVIL,MOTRIN) 800 MG tablet Take 400 mg by mouth as needed.     Magnesium 250 MG TABS Take by mouth daily.     Olive Leaf Extract 250 MG CAPS Take 250 mg by mouth daily.     OVER THE COUNTER MEDICATION Take 1 tablet by mouth daily. Liposomal D3 + K   125 mcg/D  100 mcg/K     Current Facility-Administered Medications  Medication Dose Route Frequency Provider Last Rate Last Admin   0.9 %  sodium chloride infusion  500 mL Intravenous Once Napoleon Form, MD        Allergies as of 06/20/2023 - Review Complete 06/20/2023  Allergen Reaction Noted   Peanut-containing drug products Swelling 05/29/2018   Penicillins Anaphylaxis and Shortness Of Breath 07/27/2011   Codeine Nausea And Vomiting and Nausea Only 02/18/2013   Other Swelling 09/07/2020    Family History  Problem Relation Age of Onset   Prostate cancer Father    Diabetes Father    Heart disease Father    Colon polyps Mother    Colon polyps Sister  Esophageal cancer Neg Hx    Rectal cancer Neg Hx    Stomach cancer Neg Hx    Colon cancer Neg Hx     Social History   Socioeconomic History   Marital status: Married    Spouse name: Not on file   Number of children: Not on file   Years of education: Not on file   Highest education level: Not on file  Occupational History   Not on file  Tobacco Use   Smoking status: Former    Current packs/day: 0.00    Average packs/day: 1 pack/day for 8.0 years (8.0 ttl pk-yrs)    Types: Cigarettes    Start date: 12/09/1974    Quit date: 12/09/1982    Years since quitting: 40.5   Smokeless tobacco: Never   Tobacco comments:    quit 30 years ago  Vaping Use   Vaping status: Never Used  Substance and Sexual Activity   Alcohol use: No    Alcohol/week: 0.0 standard drinks of  alcohol   Drug use: No   Sexual activity: Not on file  Other Topics Concern   Not on file  Social History Narrative   Not on file   Social Determinants of Health   Financial Resource Strain: Not on file  Food Insecurity: Not on file  Transportation Needs: Not on file  Physical Activity: Not on file  Stress: Not on file  Social Connections: Not on file  Intimate Partner Violence: Not on file    Review of Systems:  All other review of systems negative except as mentioned in the HPI.  Physical Exam: Vital signs in last 24 hours: BP 139/72   Pulse 78   Temp 98.4 F (36.9 C) (Temporal)   Resp (!) 21   Ht 5\' 6"  (1.676 m)   Wt 152 lb 3.2 oz (69 kg)   SpO2 100%   BMI 24.57 kg/m  General:   Alert, NAD Lungs:  Clear .   Heart:  Regular rate and rhythm Abdomen:  Soft, nontender and nondistended. Neuro/Psych:  Alert and cooperative. Normal mood and affect. A and O x 3  Reviewed labs, radiology imaging, old records and pertinent past GI work up  Patient is appropriate for planned procedure(s) and anesthesia in an ambulatory setting   K. Scherry Ran , MD [phone]

## 2023-06-21 ENCOUNTER — Telehealth: Payer: Self-pay

## 2023-06-21 NOTE — Telephone Encounter (Signed)
  Follow up Call-     06/20/2023    7:42 AM  Call back number  Post procedure Call Back phone  # [phone]  Permission to leave phone message Yes     Patient questions:  Do you have a fever, pain , or abdominal swelling? No. Pain Score  0 *  Have you tolerated food without any problems? Yes.    Have you been able to return to your normal activities? Yes.    Do you have any questions about your discharge instructions: Diet   No. Medications  No. Follow up visit  No.  Do you have questions or concerns about your Care? No.  Actions: * If pain score is 4 or above: No action needed, pain <4.

## 2023-06-26 ENCOUNTER — Encounter: Payer: Self-pay | Admitting: Gastroenterology

## 2023-09-04 DIAGNOSIS — Z01419 Encounter for gynecological examination (general) (routine) without abnormal findings: Secondary | ICD-10-CM | POA: Diagnosis not present

## 2023-09-04 DIAGNOSIS — Z853 Personal history of malignant neoplasm of breast: Secondary | ICD-10-CM | POA: Diagnosis not present

## 2023-09-04 DIAGNOSIS — N951 Menopausal and female climacteric states: Secondary | ICD-10-CM | POA: Diagnosis not present

## 2023-11-26 DIAGNOSIS — L814 Other melanin hyperpigmentation: Secondary | ICD-10-CM | POA: Diagnosis not present

## 2023-11-26 DIAGNOSIS — L821 Other seborrheic keratosis: Secondary | ICD-10-CM | POA: Diagnosis not present

## 2023-11-26 DIAGNOSIS — Z86018 Personal history of other benign neoplasm: Secondary | ICD-10-CM | POA: Diagnosis not present

## 2023-11-26 DIAGNOSIS — D225 Melanocytic nevi of trunk: Secondary | ICD-10-CM | POA: Diagnosis not present

## 2023-12-17 DIAGNOSIS — K080 Exfoliation of teeth due to systemic causes: Secondary | ICD-10-CM | POA: Diagnosis not present

## 2023-12-27 ENCOUNTER — Encounter: Payer: Self-pay | Admitting: Family

## 2023-12-27 ENCOUNTER — Inpatient Hospital Stay: Payer: Medicare Other | Attending: Hematology & Oncology

## 2023-12-27 ENCOUNTER — Inpatient Hospital Stay: Payer: Medicare Other | Admitting: Family

## 2023-12-27 VITALS — BP 122/58 | HR 78 | Temp 98.3°F | Resp 17 | Ht 67.0 in | Wt 158.0 lb

## 2023-12-27 DIAGNOSIS — Z853 Personal history of malignant neoplasm of breast: Secondary | ICD-10-CM | POA: Diagnosis not present

## 2023-12-27 DIAGNOSIS — Z79811 Long term (current) use of aromatase inhibitors: Secondary | ICD-10-CM | POA: Insufficient documentation

## 2023-12-27 DIAGNOSIS — M818 Other osteoporosis without current pathological fracture: Secondary | ICD-10-CM

## 2023-12-27 DIAGNOSIS — Z86000 Personal history of in-situ neoplasm of breast: Secondary | ICD-10-CM | POA: Insufficient documentation

## 2023-12-27 DIAGNOSIS — Q8500 Neurofibromatosis, unspecified: Secondary | ICD-10-CM | POA: Diagnosis not present

## 2023-12-27 DIAGNOSIS — Z9221 Personal history of antineoplastic chemotherapy: Secondary | ICD-10-CM | POA: Diagnosis not present

## 2023-12-27 DIAGNOSIS — C50611 Malignant neoplasm of axillary tail of right female breast: Secondary | ICD-10-CM

## 2023-12-27 LAB — CMP (CANCER CENTER ONLY)
ALT: 10 U/L (ref 0–44)
AST: 11 U/L — ABNORMAL LOW (ref 15–41)
Albumin: 4.1 g/dL (ref 3.5–5.0)
Alkaline Phosphatase: 77 U/L (ref 38–126)
Anion gap: 6 (ref 5–15)
BUN: 12 mg/dL (ref 8–23)
CO2: 33 mmol/L — ABNORMAL HIGH (ref 22–32)
Calcium: 10.3 mg/dL (ref 8.9–10.3)
Chloride: 102 mmol/L (ref 98–111)
Creatinine: 0.76 mg/dL (ref 0.44–1.00)
GFR, Estimated: >60 mL/min (ref >60)
Glucose, Bld: 77 mg/dL (ref 70–99)
Potassium: 4.3 mmol/L (ref 3.5–5.1)
Sodium: 141 mmol/L (ref 135–145)
Total Bilirubin: 0.4 mg/dL (ref 0.0–1.2)
Total Protein: 6.7 g/dL (ref 6.5–8.1)

## 2023-12-27 LAB — CBC WITH DIFFERENTIAL (CANCER CENTER ONLY)
Abs Immature Granulocytes: 0.04 10*3/uL (ref 0.00–0.07)
Basophils Absolute: 0.1 10*3/uL (ref 0.0–0.1)
Basophils Relative: 1 %
Eosinophils Absolute: 0.3 10*3/uL (ref 0.0–0.5)
Eosinophils Relative: 4 %
HCT: 41.2 % (ref 36.0–46.0)
Hemoglobin: 13.7 g/dL (ref 12.0–15.0)
Immature Granulocytes: 1 %
Lymphocytes Relative: 25 %
Lymphs Abs: 2 10*3/uL (ref 0.7–4.0)
MCH: 28.4 pg (ref 26.0–34.0)
MCHC: 33.3 g/dL (ref 30.0–36.0)
MCV: 85.5 fL (ref 80.0–100.0)
Monocytes Absolute: 0.6 10*3/uL (ref 0.1–1.0)
Monocytes Relative: 7 %
Neutro Abs: 5.1 10*3/uL (ref 1.7–7.7)
Neutrophils Relative %: 62 %
Platelet Count: 328 10*3/uL (ref 150–400)
RBC: 4.82 MIL/uL (ref 3.87–5.11)
RDW: 12.9 % (ref 11.5–15.5)
WBC Count: 8.2 10*3/uL (ref 4.0–10.5)
nRBC: 0 % (ref 0.0–0.2)

## 2023-12-27 LAB — LACTATE DEHYDROGENASE: LDH: 112 U/L (ref 98–192)

## 2023-12-27 LAB — VITAMIN D 25 HYDROXY (VIT D DEFICIENCY, FRACTURES): Vit D, 25-Hydroxy: 46.74 ng/mL (ref 30–100)

## 2023-12-27 NOTE — Progress Notes (Signed)
Hematology and Oncology Follow Up Visit  Anna Barton [id number] [birth year] 68 y.o. 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 She is doing well and has no complaints at this time.  She continues to work out 5-6 days a week.  Appetite and hydration are good. Weight is stable at 158 lbs.  Neurofibromatosis unchanged from baseline.  No issues with infection. No fever, chills, n/v, cough, rash, dizziness, SOB, chest pain, palpitations, abdominal pain or changes in bowel or bladder habits.  No changes to chest per patient. Implants are intact. No mass or rash.  No adenopathy or lymphedema. No falls or syncope reported.    ECOG Performance Status: 0 - Asymptomatic  Medications:  Allergies as of 12/27/2023       Reactions   Peanut-containing Drug Products Swelling   Mouth burning, and tongue swells   Penicillins Anaphylaxis, Shortness Of Breath   Codeine Nausea And Vomiting, Nausea Only   Other Swelling   ALL NUTS tongue swelling and burning         Medication List        Accurate as of December 27, 2023  1:43 PM. If you have any questions, ask your nurse or doctor.          b complex vitamins capsule Take 1 capsule by mouth daily.   citalopram 20 MG tablet Commonly known as: CELEXA daily.   ibuprofen 800 MG tablet Commonly known as: ADVIL Take 400 mg by mouth as needed.   Magnesium 250 MG Tabs Take by mouth daily.   Olive Leaf Extract 250 MG Caps Take 250 mg by mouth daily.   OVER THE COUNTER MEDICATION Take 1 tablet by mouth daily. Liposomal D3 + K   125 mcg/D  100 mcg/K   vitamin C 1000 MG tablet Take 1,650 mg by mouth daily. Take Liposomal Vitamin C 1650 mg by mouth daily.        Allergies:  Allergies  Allergen Reactions   Peanut-Containing Drug Products  Swelling    Mouth burning, and tongue swells   Penicillins Anaphylaxis and Shortness Of Breath   Codeine Nausea And Vomiting and Nausea Only   Other Swelling    ALL NUTS tongue swelling and burning     Past Medical History, Surgical history, Social history, and Family History were reviewed and updated.  Review of Systems: All other 10 point review of systems is negative.   Physical Exam:  height is 5\' 7"  (1.702 m) and weight is 158 lb (71.7 kg). Her oral temperature is 98.3 F (36.8 C). Her blood pressure is 122/58 (abnormal) and her pulse is 78. Her respiration is 17 and oxygen saturation is 100%.   Wt Readings from Last 3 Encounters:  12/27/23 158 lb (71.7 kg)  06/20/23 152 lb 3.2 oz (69 kg)  05/20/23 153 lb (69.4 kg)    Ocular: Sclerae unicteric, pupils equal, round and reactive to light Ear-nose-throat: Oropharynx clear, dentition fair Lymphatic: No cervical or supraclavicular adenopathy Lungs no rales or rhonchi, good excursion bilaterally Heart regular rate and rhythm, no murmur appreciated Abd soft, nontender, positive bowel sounds MSK no focal spinal tenderness, no joint edema Neuro: non-focal, well-oriented, appropriate affect Breasts: Same as above  Lab Results  Component Value Date   WBC 8.2 12/27/2023  HGB 13.7 12/27/2023   HCT 41.2 12/27/2023   MCV 85.5 12/27/2023   PLT 328 12/27/2023   No results found for: "FERRITIN", "IRON", "TIBC", "UIBC", "IRONPCTSAT" Lab Results  Component Value Date   RBC 4.82 12/27/2023   No results found for: "KPAFRELGTCHN", "LAMBDASER", "KAPLAMBRATIO" No results found for: "IGGSERUM", "IGA", "IGMSERUM" No results found for: "TOTALPROTELP", "ALBUMINELP", "A1GS", "A2GS", "BETS", "BETA2SER", "GAMS", "MSPIKE", "SPEI"   Chemistry      Component Value Date/Time   NA 140 12/27/2022 1324   NA 138 06/21/2017 1140   K 4.3 12/27/2022 1324   K 4.0 06/21/2017 1140   CL 102 12/27/2022 1324   CL 99 12/15/2014 1033   CO2 32 12/27/2022  1324   CO2 28 06/21/2017 1140   BUN 10 12/27/2022 1324   BUN 10.5 06/21/2017 1140   CREATININE 0.75 12/27/2022 1324   CREATININE 0.8 06/21/2017 1140      Component Value Date/Time   CALCIUM 9.8 12/27/2022 1324   CALCIUM 9.5 06/21/2017 1140   ALKPHOS 75 12/27/2022 1324   ALKPHOS 88 06/21/2017 1140   AST 13 (L) 12/27/2022 1324   AST 16 06/21/2017 1140   ALT 13 12/27/2022 1324   ALT 14 06/21/2017 1140   BILITOT 0.7 12/27/2022 1324   BILITOT 1.01 06/21/2017 1140       Impression and Plan: Anna Barton is 68 yo caucasian female with a history of stage IIa ductal carcinoma right breast. She completed systemic chemotherapy with Cytoxan.Taxotere in the adjuvant setting in December 2009. She received 5 years of Femara completed in December of 2014. Continue vitamin D supplement daily. Level pending. Will adjust if needed.  Will repeat bone density next year unless symptomatic.  She continues to do well and is asymptomatic at this time.  Follow-up in 1 year.   Eileen Stanford, NP 2/21/20251:43 PM

## 2023-12-31 ENCOUNTER — Encounter (HOSPITAL_BASED_OUTPATIENT_CLINIC_OR_DEPARTMENT_OTHER): Payer: Self-pay | Admitting: Emergency Medicine

## 2023-12-31 ENCOUNTER — Emergency Department (HOSPITAL_BASED_OUTPATIENT_CLINIC_OR_DEPARTMENT_OTHER)
Admission: EM | Admit: 2023-12-31 | Discharge: 2023-12-31 | Disposition: A | Discharge status: Home / Self Care | Payer: Medicare Other | Attending: Emergency Medicine | Admitting: Emergency Medicine

## 2023-12-31 ENCOUNTER — Other Ambulatory Visit: Payer: Self-pay

## 2023-12-31 DIAGNOSIS — R197 Diarrhea, unspecified: Secondary | ICD-10-CM | POA: Insufficient documentation

## 2023-12-31 DIAGNOSIS — Z87891 Personal history of nicotine dependence: Secondary | ICD-10-CM | POA: Diagnosis not present

## 2023-12-31 DIAGNOSIS — Z853 Personal history of malignant neoplasm of breast: Secondary | ICD-10-CM | POA: Insufficient documentation

## 2023-12-31 DIAGNOSIS — R1013 Epigastric pain: Secondary | ICD-10-CM | POA: Diagnosis not present

## 2023-12-31 DIAGNOSIS — R112 Nausea with vomiting, unspecified: Secondary | ICD-10-CM | POA: Diagnosis not present

## 2023-12-31 DIAGNOSIS — Z91010 Allergy to peanuts: Secondary | ICD-10-CM | POA: Diagnosis not present

## 2023-12-31 DIAGNOSIS — R9431 Abnormal electrocardiogram [ECG] [EKG]: Secondary | ICD-10-CM | POA: Diagnosis not present

## 2023-12-31 DIAGNOSIS — D72829 Elevated white blood cell count, unspecified: Secondary | ICD-10-CM | POA: Diagnosis not present

## 2023-12-31 DIAGNOSIS — Z9013 Acquired absence of bilateral breasts and nipples: Secondary | ICD-10-CM | POA: Diagnosis not present

## 2023-12-31 LAB — COMPREHENSIVE METABOLIC PANEL
ALT: 40 U/L (ref 0–44)
AST: 73 U/L — ABNORMAL HIGH (ref 15–41)
Albumin: 3.6 g/dL (ref 3.5–5.0)
Alkaline Phosphatase: 74 U/L (ref 38–126)
Anion gap: 10 (ref 5–15)
BUN: 16 mg/dL (ref 8–23)
CO2: 23 mmol/L (ref 22–32)
Calcium: 8.6 mg/dL — ABNORMAL LOW (ref 8.9–10.3)
Chloride: 105 mmol/L (ref 98–111)
Creatinine, Ser: 0.65 mg/dL (ref 0.44–1.00)
GFR, Estimated: >60 mL/min (ref >60)
Glucose, Bld: 180 mg/dL — ABNORMAL HIGH (ref 70–99)
Potassium: 3.8 mmol/L (ref 3.5–5.1)
Sodium: 138 mmol/L (ref 135–145)
Total Bilirubin: 1.1 mg/dL (ref 0.0–1.2)
Total Protein: 6.7 g/dL (ref 6.5–8.1)

## 2023-12-31 LAB — CBC WITH DIFFERENTIAL/PLATELET
Abs Immature Granulocytes: 0.06 10*3/uL (ref 0.00–0.07)
Basophils Absolute: 0 10*3/uL (ref 0.0–0.1)
Basophils Relative: 0 %
Eosinophils Absolute: 0.2 10*3/uL (ref 0.0–0.5)
Eosinophils Relative: 1 %
HCT: 43.5 % (ref 36.0–46.0)
Hemoglobin: 14.5 g/dL (ref 12.0–15.0)
Immature Granulocytes: 0 %
Lymphocytes Relative: 3 %
Lymphs Abs: 0.4 10*3/uL — ABNORMAL LOW (ref 0.7–4.0)
MCH: 27.9 pg (ref 26.0–34.0)
MCHC: 33.3 g/dL (ref 30.0–36.0)
MCV: 83.8 fL (ref 80.0–100.0)
Monocytes Absolute: 0.4 10*3/uL (ref 0.1–1.0)
Monocytes Relative: 3 %
Neutro Abs: 12.9 10*3/uL — ABNORMAL HIGH (ref 1.7–7.7)
Neutrophils Relative %: 93 %
Platelets: 316 10*3/uL (ref 150–400)
RBC: 5.19 MIL/uL — ABNORMAL HIGH (ref 3.87–5.11)
RDW: 12.8 % (ref 11.5–15.5)
WBC: 14 10*3/uL — ABNORMAL HIGH (ref 4.0–10.5)
nRBC: 0 % (ref 0.0–0.2)

## 2023-12-31 LAB — LIPASE, BLOOD: Lipase: 41 U/L (ref 11–51)

## 2023-12-31 LAB — URINALYSIS, ROUTINE W REFLEX MICROSCOPIC
Bilirubin Urine: NEGATIVE
Glucose, UA: NEGATIVE mg/dL
Hgb urine dipstick: NEGATIVE
Ketones, ur: NEGATIVE mg/dL
Nitrite: NEGATIVE
Protein, ur: NEGATIVE mg/dL
Specific Gravity, Urine: 1.015 (ref 1.005–1.030)
pH: 7 (ref 5.0–8.0)

## 2023-12-31 LAB — URINALYSIS, MICROSCOPIC (REFLEX)

## 2023-12-31 MED ORDER — ONDANSETRON 4 MG PO TBDP: 4.0000 mg | ORAL_TABLET | Freq: Three times a day (TID) | ORAL | 0 refills | Status: AC | PRN

## 2023-12-31 MED ORDER — ONDANSETRON HCL 4 MG/2ML IJ SOLN
4.0000 mg | Freq: Once | INTRAMUSCULAR | Status: AC
  Administered 2023-12-31: 4 mg via INTRAVENOUS
  Filled 2023-12-31: qty 2

## 2023-12-31 MED ORDER — LOPERAMIDE HCL 2 MG PO CAPS
2.0000 mg | ORAL_CAPSULE | Freq: Once | ORAL | Status: AC
  Administered 2023-12-31: 2 mg via ORAL
  Filled 2023-12-31: qty 1

## 2023-12-31 MED ORDER — FENTANYL CITRATE PF 50 MCG/ML IJ SOSY
50.0000 ug | PREFILLED_SYRINGE | Freq: Once | INTRAMUSCULAR | Status: DC
  Filled 2023-12-31: qty 1

## 2023-12-31 MED ORDER — SODIUM CHLORIDE 0.9 % IV BOLUS
1000.0000 mL | Freq: Once | INTRAVENOUS | Status: AC
  Administered 2023-12-31: 1000 mL via INTRAVENOUS

## 2023-12-31 NOTE — ED Provider Notes (Signed)
 Pt signed out by Dr. Bebe Shaggy pending symptomatic improvement and labs.  Cmp nl other than glucose sl elevated at 180, cbc with wbc sl elevated at 14, ua neg, lip nl.  Pt is now feeling much better.  She was able to tolerate fluids.  I suspect norovirus.  She did have diarrhea while here, but unfortunately, it was not collected.  She is told to return if worse.  She needs to f/u with pcp.   Jacalyn Lefevre, MD 12/31/23 1020

## 2023-12-31 NOTE — ED Triage Notes (Signed)
 Pt states woke up with epigastric about 04:30 had emesis and pain went away. Came back about. 05:00 and when away on its own.

## 2023-12-31 NOTE — ED Provider Notes (Signed)
 [facility] Provider Note   CSN: [id number] Arrival date & time: 12/31/23  0536     History  Chief Complaint  Patient presents with   Abdominal Pain    Anna Barton is a 68 y.o. female.  The history is provided by the patient.  Patient w/history of breast cancer s/p bilateral mastectomy presents with abdominal pain nausea/vomiting/diarrhea.  Patient reports she went to bed in her usual state of health and then woke up in the middle of the night with epigastric pain and cramping has had nonbloody emesis.  The pain has been intermittent.  The pain is currently resolved at this time.  She also reports 1 episode of nonbloody diarrhea. No chest pain or shortness of breath or cough is reported No sick contacts reported  She has had previous cholecystectomy No recent foreign travel Past Medical History:  Diagnosis Date   Breast CA (HCC) 2009   right breast/ 6 rounds of chemo   Neurofibromatosis type 1-like syndrome     Home Medications Prior to Admission medications   Medication Sig Start Date End Date Taking? Authorizing Provider  Ascorbic Acid (VITAMIN C) 1000 MG tablet Take 1,650 mg by mouth daily. Take Liposomal Vitamin C 1650 mg by mouth daily.    [provider]  b complex vitamins capsule Take 1 capsule by mouth daily.    [provider]  citalopram (CELEXA) 20 MG tablet daily.  12/12/15   [provider]  Magnesium 250 MG TABS Take by mouth daily.    [provider]  Olive Leaf Extract 250 MG CAPS Take 250 mg by mouth daily.    [provider]  OVER THE COUNTER MEDICATION Take 1 tablet by mouth daily. Liposomal D3 + K   125 mcg/D  100 mcg/K    [provider]      Allergies    Peanut-containing drug products, Penicillins, Codeine, and Other    Review of Systems   Review of Systems  Constitutional:  Positive for chills.  Respiratory:  Negative for cough.    Cardiovascular:  Negative for chest pain.  Gastrointestinal:  Positive for abdominal pain, diarrhea, nausea and vomiting. Negative for anal bleeding and blood in stool.    Physical Exam Updated Vital Signs BP 104/82 (BP Location: Left Arm)   Pulse 78   Temp (!) 97.4 F (36.3 C) (Oral)   Resp 18   Ht 1.702 m (5\' 7" )   Wt 71.7 kg   SpO2 100%   BMI 24.74 kg/m  Physical Exam CONSTITUTIONAL: Ill-appearing HEAD: Normocephalic/atraumatic EYES: EOMI/PERRL, no icterus ENMT: Mucous membranes dry NECK: supple no meningeal signs CV: S1/S2 noted, no murmurs/rubs/gallops noted LUNGS: Lungs are clear to auscultation bilaterally, no apparent distress ABDOMEN: soft, mild epigastric tenderness, no rebound or guarding, bowel sounds noted throughout abdomen NEURO: Pt is awake/alert/appropriate, moves all extremitiesx4.  No facial droop.   EXTREMITIES: pulses normal/equal SKIN: warm, color normal PSYCH: anxious  ED Results / Procedures / Treatments   Labs (all labs ordered are listed, but only abnormal results are displayed) Labs Reviewed  CBC WITH DIFFERENTIAL/PLATELET - Abnormal; Notable for the following components:      Result Value   WBC 14.0 (*)    RBC 5.19 (*)    Neutro Abs 12.9 (*)    Lymphs Abs 0.4 (*)    All other components within normal limits  COMPREHENSIVE METABOLIC PANEL  LIPASE, BLOOD    EKG EKG Interpretation Date/Time:  Tuesday December 31 2023 06:10:48 EST Ventricular Rate:  71 PR Interval:  213 QRS Duration:  96 QT Interval:  446 QTC Calculation: 485 R Axis:   94  Text Interpretation: Sinus rhythm Borderline prolonged PR interval Right axis deviation Confirmed by Zadie Rhine ([id number]) on 12/31/2023 6:15:17 AM  Radiology No results found.  Procedures Procedures    Medications Ordered in ED Medications  fentaNYL (SUBLIMAZE) injection 50 mcg (has no administration in time range)  sodium chloride 0.9 % bolus 1,000 mL (1,000 mLs Intravenous New  Bag/Given 12/31/23 0631)  ondansetron (ZOFRAN) injection 4 mg (4 mg Intravenous Given 12/31/23 0641)    ED Course/ Medical Decision Making/ A&P Clinical Course as of 12/31/23 0656  Tue Dec 31, 2023  0655 WBC(!): 14.0 Leukocytosis [DW]  6045 Signed out to dr Particia Nearing with labs pending, if pain persists she may need CT imaging [DW]    Clinical Course User Index [DW] Zadie Rhine, MD                                 Medical Decision Making Amount and/or Complexity of Data Reviewed Labs: ordered. Decision-making details documented in ED Course. ECG/medicine tests: ordered.  Risk Prescription drug management.   This patient presents to the ED for concern of abdominal pain with vomiting and diarrhea, this involves an extensive number of treatment options, and is a complaint that carries with it a high risk of complications and morbidity.  The differential diagnosis includes but is not limited to  pancreatitis, gastritis, peptic ulcer disease, appendicitis, bowel obstruction, bowel perforation, diverticulitis, AAA, ischemic bowel, gastroenteritis  Comorbidities that complicate the patient evaluation: Patient's presentation is complicated by their history of breast cancer  Social Determinants of Health: Patient's  former tobacco use   increases the complexity of managing their presentation  Additional history obtained: Additional history obtained from spouse Records reviewed  outpatient records reviewed  Lab Tests: I Ordered, and personally interpreted labs.  The pertinent results include: Leukocytosis   Cardiac Monitoring: The patient was maintained on a cardiac monitor.  I personally viewed and interpreted the cardiac monitor which showed an underlying rhythm of:  sinus rhythm  Medicines ordered and prescription drug management: I ordered medication including Zofran for nausea  Complexity of problems addressed: Patient's presentation is most consistent with  acute  presentation with potential threat to life or bodily function         Final Clinical Impression(s) / ED Diagnoses Final diagnoses:  None    Rx / DC Orders ED Discharge Orders     None         Zadie Rhine, MD 12/31/23 [phone]

## 2024-01-09 DIAGNOSIS — E7800 Pure hypercholesterolemia, unspecified: Secondary | ICD-10-CM | POA: Diagnosis not present

## 2024-01-09 DIAGNOSIS — Z0000 Encounter for general adult medical examination without abnormal findings: Secondary | ICD-10-CM | POA: Diagnosis not present

## 2024-05-07 DIAGNOSIS — H524 Presbyopia: Secondary | ICD-10-CM | POA: Diagnosis not present

## 2024-12-25 ENCOUNTER — Ambulatory Visit: Payer: Medicare Other | Admitting: Family

## 2024-12-25 ENCOUNTER — Inpatient Hospital Stay: Payer: Medicare Other
